# Patient Record
Sex: Female | Born: 1985 | Race: White | Hispanic: No | Marital: Married | Smoking: Never smoker
Health system: Southern US, Community
[De-identification: ages and names within clinical notes are randomized; demographics above are authoritative.]

## PROBLEM LIST (undated history)

## (undated) DIAGNOSIS — K219 Gastro-esophageal reflux disease without esophagitis: Secondary | ICD-10-CM

## (undated) DIAGNOSIS — F329 Major depressive disorder, single episode, unspecified: Secondary | ICD-10-CM

## (undated) DIAGNOSIS — F32A Depression, unspecified: Secondary | ICD-10-CM

## (undated) DIAGNOSIS — D219 Benign neoplasm of connective and other soft tissue, unspecified: Secondary | ICD-10-CM

## (undated) HISTORY — DX: Gastro-esophageal reflux disease without esophagitis: K21.9

## (undated) HISTORY — DX: Depression, unspecified: F32.A

## (undated) HISTORY — DX: Benign neoplasm of connective and other soft tissue, unspecified: D21.9

---
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
## 2019-07-06 LAB — OB RESULTS CONSOLE GC/CHLAMYDIA
Chlamydia: NEGATIVE
Gonorrhea: NEGATIVE

## 2019-07-06 LAB — OB RESULTS CONSOLE HEPATITIS B SURFACE ANTIGEN: Hepatitis B Surface Ag: NEGATIVE

## 2019-07-06 LAB — OB RESULTS CONSOLE ABO/RH: RH Type: NEGATIVE

## 2019-07-06 LAB — OB RESULTS CONSOLE RPR: RPR: NONREACTIVE

## 2019-07-06 LAB — OB RESULTS CONSOLE HIV ANTIBODY (ROUTINE TESTING): HIV: NONREACTIVE

## 2019-07-06 LAB — OB RESULTS CONSOLE ANTIBODY SCREEN: Antibody Screen: NEGATIVE

## 2019-07-06 LAB — OB RESULTS CONSOLE RUBELLA ANTIBODY, IGM: Rubella: IMMUNE

## 2020-01-02 LAB — OB RESULTS CONSOLE GBS: GBS: NEGATIVE

## 2020-01-22 ENCOUNTER — Telehealth (HOSPITAL_COMMUNITY): Payer: Self-pay | Admitting: *Deleted

## 2020-01-22 ENCOUNTER — Encounter (HOSPITAL_COMMUNITY): Payer: Self-pay | Admitting: *Deleted

## 2020-01-22 NOTE — Telephone Encounter (Signed)
Preadmission screen  

## 2020-01-23 ENCOUNTER — Telehealth (HOSPITAL_COMMUNITY): Payer: Self-pay | Admitting: *Deleted

## 2020-01-23 NOTE — Telephone Encounter (Signed)
Preadmission screen  

## 2020-01-24 ENCOUNTER — Telehealth (HOSPITAL_COMMUNITY): Payer: Self-pay | Admitting: *Deleted

## 2020-01-24 NOTE — Telephone Encounter (Signed)
Preadmission screen  

## 2020-01-26 ENCOUNTER — Telehealth (HOSPITAL_COMMUNITY): Payer: Self-pay | Admitting: *Deleted

## 2020-01-26 NOTE — Telephone Encounter (Signed)
Preadmission screen  

## 2020-01-29 ENCOUNTER — Telehealth (HOSPITAL_COMMUNITY): Payer: Self-pay | Admitting: *Deleted

## 2020-01-29 NOTE — Telephone Encounter (Signed)
Preadmission screen  

## 2020-01-30 ENCOUNTER — Encounter (HOSPITAL_COMMUNITY): Payer: Self-pay | Admitting: *Deleted

## 2020-01-30 ENCOUNTER — Telehealth (HOSPITAL_COMMUNITY): Payer: Self-pay | Admitting: *Deleted

## 2020-01-30 NOTE — Telephone Encounter (Signed)
Preadmission screen  

## 2020-01-31 ENCOUNTER — Other Ambulatory Visit (HOSPITAL_COMMUNITY)
Admission: RE | Admit: 2020-01-31 | Discharge: 2020-01-31 | Disposition: A | Discharge status: Home / Self Care | Payer: Managed Care, Other (non HMO) | Source: Ambulatory Visit | Attending: Obstetrics and Gynecology | Admitting: Obstetrics and Gynecology

## 2020-01-31 LAB — SARS CORONAVIRUS 2 (TAT 6-24 HRS): SARS Coronavirus 2: NEGATIVE

## 2020-02-01 ENCOUNTER — Other Ambulatory Visit: Payer: Self-pay | Admitting: Obstetrics and Gynecology

## 2020-02-01 NOTE — H&P (Signed)
Shelby Oconnor is a 34 y.o. female G2P1001 at 50 4/7 weeks (EDD 01/29/20 by LMP c/w 10 week [place])  presenting for  OB History    Gravida  2   Para  1   Term      Preterm      AB      Living        SAB      TAB      Ectopic      Multiple      Live Births             Past Medical History:  Diagnosis Date  . Depression   . Fibroid   . GERD (gastroesophageal reflux disease)    No past surgical history on file. Family History: family history includes Anxiety disorder in her daughter and sister; Cancer in her mother and paternal grandmother; Depression in her maternal aunt; Diabetes in her maternal aunt and paternal grandmother. Social History:  reports that she has never smoked. She has never used smokeless tobacco. She reports previous alcohol use. She reports that she does not use drugs.     Maternal Diabetes: No Genetic Screening: Declined Maternal Ultrasounds/Referrals: Normal Fetal Ultrasounds or other Referrals:  None Maternal Substance Abuse:  No Significant Maternal Medications:  None Significant Maternal Lab Results:  Rh negative Other Comments:  None  Review of Systems  Constitutional: Negative for fever.  Gastrointestinal: Negative for abdominal pain.   Maternal Medical History:  Contractions: Frequency: rare.   Perceived severity is mild.    Fetal activity: Perceived fetal activity is normal.    Prenatal complications: no prenatal complications Prenatal Complications - Diabetes: none.      Last menstrual period 04/24/2019. Maternal Exam:  Uterine Assessment: Contraction strength is mild.  Contraction frequency is irregular.   Abdomen: Patient reports no abdominal tenderness. Introitus: Normal vulva. Normal vagina.    Physical Exam  Constitutional: She appears well-developed.  Cardiovascular: Normal rate and regular rhythm.  Respiratory: Effort normal.  GI: Soft.  Genitourinary:    Vulva and vagina normal.   Neurological: She is alert.   Psychiatric: She has a normal mood and affect.    Prenatal labs: ABO, Rh: O/Negative/-- (08/06 0000) Antibody: Negative (08/06 0000) Rubella: Immune (08/06 0000) RPR: Nonreactive (08/06 0000)  HBsAg: Negative (08/06 0000)  HIV: Non-reactive (08/06 0000)  GBS: Negative/-- (02/02 0000)  One hour GCT 130  Assessment/Plan: Pt for IOL at term, plan AROM and pitocin.  Epidural prn.  Rh negative   Logan Bores 02/01/2020, 2:41 PM

## 2020-02-01 NOTE — H&P (Deleted)
  The note originally documented on this encounter has been moved the the encounter in which it belongs.  

## 2020-02-02 ENCOUNTER — Inpatient Hospital Stay (HOSPITAL_COMMUNITY)
Admission: AD | Admit: 2020-02-02 | Discharge: 2020-02-04 | DRG: 788 | Disposition: A | Discharge status: Home / Self Care | Payer: Managed Care, Other (non HMO) | Attending: Obstetrics and Gynecology | Admitting: Obstetrics and Gynecology

## 2020-02-02 ENCOUNTER — Inpatient Hospital Stay (HOSPITAL_COMMUNITY): Payer: Managed Care, Other (non HMO)

## 2020-02-02 ENCOUNTER — Inpatient Hospital Stay (HOSPITAL_COMMUNITY): Payer: Managed Care, Other (non HMO) | Admitting: Anesthesiology

## 2020-02-02 ENCOUNTER — Other Ambulatory Visit: Payer: Self-pay

## 2020-02-02 ENCOUNTER — Encounter (HOSPITAL_COMMUNITY): Payer: Self-pay | Admitting: Obstetrics and Gynecology

## 2020-02-02 ENCOUNTER — Encounter (HOSPITAL_COMMUNITY)
Admission: AD | Disposition: A | Discharge status: Home / Self Care | Payer: Self-pay | Source: Home / Self Care | Attending: Obstetrics and Gynecology

## 2020-02-02 DIAGNOSIS — Z3A40 40 weeks gestation of pregnancy: Secondary | ICD-10-CM | POA: Diagnosis not present

## 2020-02-02 DIAGNOSIS — O26893 Other specified pregnancy related conditions, third trimester: Principal | ICD-10-CM | POA: Diagnosis present

## 2020-02-02 DIAGNOSIS — Z20822 Contact with and (suspected) exposure to covid-19: Secondary | ICD-10-CM | POA: Diagnosis present

## 2020-02-02 DIAGNOSIS — Z6791 Unspecified blood type, Rh negative: Secondary | ICD-10-CM | POA: Diagnosis not present

## 2020-02-02 DIAGNOSIS — Z98891 History of uterine scar from previous surgery: Secondary | ICD-10-CM

## 2020-02-02 LAB — CBC
HCT: 38.8 % (ref 36.0–46.0)
Hemoglobin: 12.9 g/dL (ref 12.0–15.0)
MCH: 29.7 pg (ref 26.0–34.0)
MCHC: 33.2 g/dL (ref 30.0–36.0)
MCV: 89.4 fL (ref 80.0–100.0)
Platelets: 299 10*3/uL (ref 150–400)
RBC: 4.34 MIL/uL (ref 3.87–5.11)
RDW: 14.6 % (ref 11.5–15.5)
WBC: 10.2 10*3/uL (ref 4.0–10.5)
nRBC: 0 % (ref 0.0–0.2)

## 2020-02-02 LAB — RPR: RPR Ser Ql: NONREACTIVE

## 2020-02-02 LAB — COMPREHENSIVE METABOLIC PANEL
ALT: 27 U/L (ref 0–44)
AST: 25 U/L (ref 15–41)
Albumin: 2.8 g/dL — ABNORMAL LOW (ref 3.5–5.0)
Alkaline Phosphatase: 180 U/L — ABNORMAL HIGH (ref 38–126)
Anion gap: 12 (ref 5–15)
BUN: 9 mg/dL (ref 6–20)
CO2: 19 mmol/L — ABNORMAL LOW (ref 22–32)
Calcium: 9.3 mg/dL (ref 8.9–10.3)
Chloride: 105 mmol/L (ref 98–111)
Creatinine, Ser: 0.61 mg/dL (ref 0.44–1.00)
GFR calc Af Amer: >60 mL/min (ref >60)
GFR calc non Af Amer: >60 mL/min (ref >60)
Glucose, Bld: 110 mg/dL — ABNORMAL HIGH (ref 70–99)
Potassium: 3.6 mmol/L (ref 3.5–5.1)
Sodium: 136 mmol/L (ref 135–145)
Total Bilirubin: 0.3 mg/dL (ref 0.3–1.2)
Total Protein: 6 g/dL — ABNORMAL LOW (ref 6.5–8.1)

## 2020-02-02 LAB — TYPE AND SCREEN
ABO/RH(D): O NEG
Antibody Screen: NEGATIVE

## 2020-02-02 LAB — PROTEIN / CREATININE RATIO, URINE
Creatinine, Urine: 47.09 mg/dL
Protein Creatinine Ratio: 0.25 mg/mg{Cre} — ABNORMAL HIGH (ref 0.00–0.15)
Total Protein, Urine: 12 mg/dL

## 2020-02-02 SURGERY — Surgical Case
Anesthesia: Epidural | Wound class: Clean Contaminated

## 2020-02-02 MED ORDER — PHENYLEPHRINE 40 MCG/ML (10ML) SYRINGE FOR IV PUSH (FOR BLOOD PRESSURE SUPPORT)
80.0000 ug | PREFILLED_SYRINGE | INTRAVENOUS | Status: DC | PRN
  Filled 2020-02-02: qty 10

## 2020-02-02 MED ORDER — OXYCODONE-ACETAMINOPHEN 5-325 MG PO TABS: 1.0000 | ORAL_TABLET | ORAL | Status: DC | PRN

## 2020-02-02 MED ORDER — LACTATED RINGERS IV SOLN
500.0000 mL | Freq: Once | INTRAVENOUS | Status: AC
  Administered 2020-02-02: 500 mL via INTRAVENOUS

## 2020-02-02 MED ORDER — BUTORPHANOL TARTRATE 1 MG/ML IJ SOLN: 1.0000 mg | INTRAMUSCULAR | Status: DC | PRN

## 2020-02-02 MED ORDER — SODIUM CHLORIDE (PF) 0.9 % IJ SOLN
INTRAMUSCULAR | Status: DC | PRN
  Administered 2020-02-02: 12 mL/h via EPIDURAL

## 2020-02-02 MED ORDER — NALOXONE HCL 4 MG/10ML IJ SOLN
1.0000 ug/kg/h | INTRAVENOUS | Status: DC | PRN
  Filled 2020-02-02: qty 5

## 2020-02-02 MED ORDER — SCOPOLAMINE 1 MG/3DAYS TD PT72
1.0000 | MEDICATED_PATCH | Freq: Once | TRANSDERMAL | Status: DC
  Administered 2020-02-02: 1.5 mg via TRANSDERMAL

## 2020-02-02 MED ORDER — LIDOCAINE-EPINEPHRINE (PF) 2 %-1:200000 IJ SOLN
INTRAMUSCULAR | Status: DC | PRN
  Administered 2020-02-02 (×2): 2 mL via EPIDURAL

## 2020-02-02 MED ORDER — OXYCODONE-ACETAMINOPHEN 5-325 MG PO TABS: 2.0000 | ORAL_TABLET | ORAL | Status: DC | PRN

## 2020-02-02 MED ORDER — CEFAZOLIN SODIUM-DEXTROSE 2-4 GM/100ML-% IV SOLN
2.0000 g | Freq: Once | INTRAVENOUS | Status: AC
  Administered 2020-02-02: 2 g via INTRAVENOUS

## 2020-02-02 MED ORDER — OXYTOCIN 40 UNITS IN NORMAL SALINE INFUSION - SIMPLE MED
INTRAVENOUS | Status: AC
  Filled 2020-02-02: qty 1000

## 2020-02-02 MED ORDER — NALBUPHINE HCL 10 MG/ML IJ SOLN: 5.0000 mg | Freq: Once | INTRAMUSCULAR | Status: DC | PRN

## 2020-02-02 MED ORDER — NALOXONE HCL 0.4 MG/ML IJ SOLN: 0.4000 mg | INTRAMUSCULAR | Status: DC | PRN

## 2020-02-02 MED ORDER — SENNOSIDES-DOCUSATE SODIUM 8.6-50 MG PO TABS
2.0000 | ORAL_TABLET | ORAL | Status: DC
  Administered 2020-02-03 (×2): 2 via ORAL
  Filled 2020-02-02 (×2): qty 2

## 2020-02-02 MED ORDER — DEXAMETHASONE SODIUM PHOSPHATE 10 MG/ML IJ SOLN
INTRAMUSCULAR | Status: AC
  Filled 2020-02-02: qty 1

## 2020-02-02 MED ORDER — IBUPROFEN 800 MG PO TABS
800.0000 mg | ORAL_TABLET | Freq: Three times a day (TID) | ORAL | Status: DC
  Administered 2020-02-03 – 2020-02-04 (×5): 800 mg via ORAL
  Filled 2020-02-02 (×5): qty 1

## 2020-02-02 MED ORDER — DIPHENHYDRAMINE HCL 50 MG/ML IJ SOLN: 12.5000 mg | INTRAMUSCULAR | Status: DC | PRN

## 2020-02-02 MED ORDER — LACTATED RINGERS IV SOLN: INTRAVENOUS | Status: DC

## 2020-02-02 MED ORDER — DEXAMETHASONE SODIUM PHOSPHATE 10 MG/ML IJ SOLN
INTRAMUSCULAR | Status: DC | PRN
  Administered 2020-02-02: 10 mg via INTRAVENOUS

## 2020-02-02 MED ORDER — SIMETHICONE 80 MG PO CHEW
80.0000 mg | CHEWABLE_TABLET | Freq: Three times a day (TID) | ORAL | Status: DC
  Administered 2020-02-03 – 2020-02-04 (×5): 80 mg via ORAL
  Filled 2020-02-02 (×5): qty 1

## 2020-02-02 MED ORDER — ONDANSETRON HCL 4 MG/2ML IJ SOLN: 4.0000 mg | Freq: Three times a day (TID) | INTRAMUSCULAR | Status: DC | PRN

## 2020-02-02 MED ORDER — FENTANYL-BUPIVACAINE-NACL 0.5-0.125-0.9 MG/250ML-% EP SOLN
12.0000 mL/h | EPIDURAL | Status: DC | PRN
  Filled 2020-02-02: qty 250

## 2020-02-02 MED ORDER — ONDANSETRON HCL 4 MG/2ML IJ SOLN
INTRAMUSCULAR | Status: AC
  Filled 2020-02-02: qty 2

## 2020-02-02 MED ORDER — SIMETHICONE 80 MG PO CHEW: 80.0000 mg | CHEWABLE_TABLET | ORAL | Status: DC | PRN

## 2020-02-02 MED ORDER — SODIUM CHLORIDE 0.9 % IV SOLN: INTRAVENOUS | Status: DC | PRN

## 2020-02-02 MED ORDER — COCONUT OIL OIL
1.0000 "application " | TOPICAL_OIL | Status: DC | PRN
  Administered 2020-02-03: 1 via TOPICAL

## 2020-02-02 MED ORDER — TERBUTALINE SULFATE 1 MG/ML IJ SOLN: 0.2500 mg | Freq: Once | INTRAMUSCULAR | Status: DC | PRN

## 2020-02-02 MED ORDER — ACETAMINOPHEN 325 MG PO TABS
650.0000 mg | ORAL_TABLET | ORAL | Status: DC | PRN
  Administered 2020-02-03 (×2): 650 mg via ORAL
  Filled 2020-02-02 (×3): qty 2

## 2020-02-02 MED ORDER — BUPIVACAINE HCL (PF) 0.75 % IJ SOLN
INTRAMUSCULAR | Status: DC | PRN
  Administered 2020-02-02: 1.2 mL

## 2020-02-02 MED ORDER — MENTHOL 3 MG MT LOZG: 1.0000 | LOZENGE | OROMUCOSAL | Status: DC | PRN

## 2020-02-02 MED ORDER — OXYTOCIN 40 UNITS IN NORMAL SALINE INFUSION - SIMPLE MED
2.5000 [IU]/h | INTRAVENOUS | Status: DC
  Administered 2020-02-02: 40 [IU] via INTRAVENOUS

## 2020-02-02 MED ORDER — PHENYLEPHRINE 40 MCG/ML (10ML) SYRINGE FOR IV PUSH (FOR BLOOD PRESSURE SUPPORT): 80.0000 ug | PREFILLED_SYRINGE | INTRAVENOUS | Status: DC | PRN

## 2020-02-02 MED ORDER — TETANUS-DIPHTH-ACELL PERTUSSIS 5-2.5-18.5 LF-MCG/0.5 IM SUSP: 0.5000 mL | Freq: Once | INTRAMUSCULAR | Status: DC

## 2020-02-02 MED ORDER — EPHEDRINE 5 MG/ML INJ: 10.0000 mg | INTRAVENOUS | Status: DC | PRN

## 2020-02-02 MED ORDER — OXYTOCIN 40 UNITS IN NORMAL SALINE INFUSION - SIMPLE MED
1.0000 m[IU]/min | INTRAVENOUS | Status: DC
  Administered 2020-02-02: 2 m[IU]/min via INTRAVENOUS
  Filled 2020-02-02: qty 1000

## 2020-02-02 MED ORDER — SODIUM CHLORIDE 0.9 % IR SOLN
Status: DC | PRN
  Administered 2020-02-02: 1000 mL

## 2020-02-02 MED ORDER — PRENATAL MULTIVITAMIN CH
1.0000 | ORAL_TABLET | Freq: Every day | ORAL | Status: DC
  Administered 2020-02-03 – 2020-02-04 (×2): 1 via ORAL
  Filled 2020-02-02 (×2): qty 1

## 2020-02-02 MED ORDER — DIBUCAINE (PERIANAL) 1 % EX OINT: 1.0000 "application " | TOPICAL_OINTMENT | CUTANEOUS | Status: DC | PRN

## 2020-02-02 MED ORDER — PHENYLEPHRINE HCL-NACL 20-0.9 MG/250ML-% IV SOLN
INTRAVENOUS | Status: AC
  Filled 2020-02-02: qty 250

## 2020-02-02 MED ORDER — SOD CITRATE-CITRIC ACID 500-334 MG/5ML PO SOLN
30.0000 mL | ORAL | Status: DC | PRN
  Filled 2020-02-02: qty 30

## 2020-02-02 MED ORDER — PROMETHAZINE HCL 25 MG/ML IJ SOLN: 6.2500 mg | INTRAMUSCULAR | Status: DC | PRN

## 2020-02-02 MED ORDER — DIPHENHYDRAMINE HCL 25 MG PO CAPS: 25.0000 mg | ORAL_CAPSULE | ORAL | Status: DC | PRN

## 2020-02-02 MED ORDER — OXYTOCIN BOLUS FROM INFUSION: 500.0000 mL | Freq: Once | INTRAVENOUS | Status: DC

## 2020-02-02 MED ORDER — MORPHINE SULFATE (PF) 0.5 MG/ML IJ SOLN
INTRAMUSCULAR | Status: AC
  Filled 2020-02-02: qty 10

## 2020-02-02 MED ORDER — DIPHENHYDRAMINE HCL 25 MG PO CAPS: 25.0000 mg | ORAL_CAPSULE | Freq: Four times a day (QID) | ORAL | Status: DC | PRN

## 2020-02-02 MED ORDER — NALBUPHINE HCL 10 MG/ML IJ SOLN: 5.0000 mg | INTRAMUSCULAR | Status: DC | PRN

## 2020-02-02 MED ORDER — MEPERIDINE HCL 25 MG/ML IJ SOLN: 6.2500 mg | INTRAMUSCULAR | Status: DC | PRN

## 2020-02-02 MED ORDER — FENTANYL CITRATE (PF) 100 MCG/2ML IJ SOLN
INTRAMUSCULAR | Status: AC
  Filled 2020-02-02: qty 2

## 2020-02-02 MED ORDER — WITCH HAZEL-GLYCERIN EX PADS: 1.0000 "application " | MEDICATED_PAD | CUTANEOUS | Status: DC | PRN

## 2020-02-02 MED ORDER — ONDANSETRON HCL 4 MG/2ML IJ SOLN
4.0000 mg | Freq: Four times a day (QID) | INTRAMUSCULAR | Status: DC | PRN
  Administered 2020-02-02: 20:00:00 4 mg via INTRAVENOUS

## 2020-02-02 MED ORDER — SIMETHICONE 80 MG PO CHEW
80.0000 mg | CHEWABLE_TABLET | ORAL | Status: DC
  Administered 2020-02-03 (×2): 80 mg via ORAL
  Filled 2020-02-02 (×2): qty 1

## 2020-02-02 MED ORDER — FENTANYL CITRATE (PF) 100 MCG/2ML IJ SOLN
INTRAMUSCULAR | Status: DC | PRN
  Administered 2020-02-02: 10 ug via INTRATHECAL

## 2020-02-02 MED ORDER — SCOPOLAMINE 1 MG/3DAYS TD PT72
MEDICATED_PATCH | TRANSDERMAL | Status: AC
  Filled 2020-02-02: qty 1

## 2020-02-02 MED ORDER — ACETAMINOPHEN 325 MG PO TABS: 650.0000 mg | ORAL_TABLET | ORAL | Status: DC | PRN

## 2020-02-02 MED ORDER — LACTATED RINGERS IV SOLN: INTRAVENOUS | Status: DC | PRN

## 2020-02-02 MED ORDER — OXYTOCIN 40 UNITS IN NORMAL SALINE INFUSION - SIMPLE MED: 2.5000 [IU]/h | INTRAVENOUS | Status: AC

## 2020-02-02 MED ORDER — SODIUM CHLORIDE 0.9% FLUSH: 3.0000 mL | INTRAVENOUS | Status: DC | PRN

## 2020-02-02 MED ORDER — OXYCODONE HCL 5 MG PO TABS
5.0000 mg | ORAL_TABLET | ORAL | Status: DC | PRN
  Administered 2020-02-03 – 2020-02-04 (×3): 5 mg via ORAL
  Filled 2020-02-02 (×3): qty 1

## 2020-02-02 MED ORDER — LACTATED RINGERS IV SOLN
500.0000 mL | INTRAVENOUS | Status: DC | PRN
  Administered 2020-02-02: 500 mL via INTRAVENOUS

## 2020-02-02 MED ORDER — MORPHINE SULFATE (PF) 0.5 MG/ML IJ SOLN
INTRAMUSCULAR | Status: DC | PRN
  Administered 2020-02-02: .1 mg via INTRATHECAL

## 2020-02-02 MED ORDER — ZOLPIDEM TARTRATE 5 MG PO TABS: 5.0000 mg | ORAL_TABLET | Freq: Every evening | ORAL | Status: DC | PRN

## 2020-02-02 MED ORDER — LIDOCAINE HCL (PF) 1 % IJ SOLN: 30.0000 mL | INTRAMUSCULAR | Status: DC | PRN

## 2020-02-02 MED ORDER — PHENYLEPHRINE HCL-NACL 20-0.9 MG/250ML-% IV SOLN
INTRAVENOUS | Status: DC | PRN
  Administered 2020-02-02: 60 ug/min via INTRAVENOUS

## 2020-02-02 MED ORDER — FENTANYL CITRATE (PF) 100 MCG/2ML IJ SOLN: 25.0000 ug | INTRAMUSCULAR | Status: DC | PRN

## 2020-02-02 MED ORDER — CEFAZOLIN SODIUM-DEXTROSE 2-4 GM/100ML-% IV SOLN
INTRAVENOUS | Status: AC
  Filled 2020-02-02: qty 100

## 2020-02-02 SURGICAL SUPPLY — 37 items
BENZOIN TINCTURE PRP APPL 2/3 (GAUZE/BANDAGES/DRESSINGS) ×3 IMPLANT
CHLORAPREP W/TINT 26ML (MISCELLANEOUS) ×3 IMPLANT
CLAMP CORD UMBIL (MISCELLANEOUS) IMPLANT
CLOSURE STERI STRIP 1/2 X4 (GAUZE/BANDAGES/DRESSINGS) ×3 IMPLANT
CLOSURE WOUND 1/2 X4 (GAUZE/BANDAGES/DRESSINGS)
CLOTH BEACON ORANGE TIMEOUT ST (SAFETY) ×3 IMPLANT
DRSG OPSITE POSTOP 4X10 (GAUZE/BANDAGES/DRESSINGS) ×3 IMPLANT
ELECT REM PT RETURN 9FT ADLT (ELECTROSURGICAL) ×3
ELECTRODE REM PT RTRN 9FT ADLT (ELECTROSURGICAL) ×1 IMPLANT
EXTRACTOR VACUUM KIWI (MISCELLANEOUS) IMPLANT
GLOVE BIO SURGEON STRL SZ 6.5 (GLOVE) ×2 IMPLANT
GLOVE BIO SURGEONS STRL SZ 6.5 (GLOVE) ×1
GLOVE BIOGEL PI IND STRL 7.0 (GLOVE) ×1 IMPLANT
GLOVE BIOGEL PI INDICATOR 7.0 (GLOVE) ×2
GOWN STRL REUS W/TWL LRG LVL3 (GOWN DISPOSABLE) ×6 IMPLANT
HOVERMATT SINGLE USE (MISCELLANEOUS) ×3 IMPLANT
KIT ABG SYR 3ML LUER SLIP (SYRINGE) IMPLANT
NEEDLE HYPO 25X5/8 SAFETYGLIDE (NEEDLE) IMPLANT
NS IRRIG 1000ML POUR BTL (IV SOLUTION) ×3 IMPLANT
PACK C SECTION WH (CUSTOM PROCEDURE TRAY) ×3 IMPLANT
PAD OB MATERNITY 4.3X12.25 (PERSONAL CARE ITEMS) ×3 IMPLANT
PENCIL SMOKE EVAC W/HOLSTER (ELECTROSURGICAL) ×3 IMPLANT
RTRCTR C-SECT PINK 25CM LRG (MISCELLANEOUS) ×3 IMPLANT
STRIP CLOSURE SKIN 1/2X4 (GAUZE/BANDAGES/DRESSINGS) IMPLANT
SUT CHROMIC 1 CTX 36 (SUTURE) ×6 IMPLANT
SUT PLAIN 0 NONE (SUTURE) IMPLANT
SUT PLAIN 2 0 XLH (SUTURE) ×3 IMPLANT
SUT VIC AB 0 CT1 27 (SUTURE) ×4
SUT VIC AB 0 CT1 27XBRD ANBCTR (SUTURE) ×2 IMPLANT
SUT VIC AB 2-0 CT1 27 (SUTURE) ×2
SUT VIC AB 2-0 CT1 TAPERPNT 27 (SUTURE) ×1 IMPLANT
SUT VIC AB 3-0 CT1 27 (SUTURE)
SUT VIC AB 3-0 CT1 TAPERPNT 27 (SUTURE) IMPLANT
SUT VIC AB 4-0 KS 27 (SUTURE) ×3 IMPLANT
TOWEL OR 17X24 6PK STRL BLUE (TOWEL DISPOSABLE) ×3 IMPLANT
TRAY FOLEY W/BAG SLVR 14FR LF (SET/KITS/TRAYS/PACK) ×3 IMPLANT
WATER STERILE IRR 1000ML POUR (IV SOLUTION) ×3 IMPLANT

## 2020-02-02 NOTE — Progress Notes (Signed)
Patient ID: Shelby Oconnor, female   DOB: [birth year], 34 y.o.   MRN: [id number] Pt having some pain on her left side s/p epidural with contractions, feeling some rectal pressure  afeb VSS FHR category 1  Cervix 80/8/-1   IUPC placed to adjust pitocin on 8 mu  Pt progressing, baby feels OP so placed in exaggerated Sims to see if can get more descent  Will follow

## 2020-02-02 NOTE — Op Note (Signed)
Operative Note    Preoperative Diagnosis Term pregnancy at 40 4/7 weeks Arrest of descent  Postoperative Diagnosis same  Procedure Primary Low Transverse C-section with 2 layer closure of uterus  Surgeon Paula Compton, MD  Anesthesia Spinal  Fluids: EBL 225mL UOP 243mL  IVF 2266ml LR  Findings Viable female infant in the vertex presentation.  Apgars 8,9.  Weight pending Nml uterus, tubes and ovaries    Specimen Placenta to L&D  Procedure Note  Patient was taken to the operating room where epidural anesthesia was pulled as it was not working on left side and a spinal was placed and found to be adequate by testing. She was prepped and draped in the normal sterile fashion in the dorsal supine position with a leftward tilt. An appropriate time out was performed. A Pfannenstiel skin incision was then made with the scalpel and carried through to the underlying layer of fascia by sharp dissection and Bovie cautery. The fascia was nicked in the midline and the incision was extended laterally with Mayo scissors. The inferior aspect of the incision was grasped Coker clamps and dissected off the underlying rectus muscles. In a similar fashion the superior aspect was dissected off the rectus muscles. Rectus muscles were separated in the midline and the peritoneal cavity entered bluntly. The peritoneal incision was then extended both superiorly and inferiorly with careful attention to avoid both bowel and bladder. The Alexis self-retaining wound retractor was then placed within the incision and the lower uterine segment exposed. The bladder flap was developed with Metzenbaum scissors and pushed away from the lower uterine segment. The lower uterine segment was then incised in a transverse fashion and the cavity itself entered bluntly. The incision was extended bluntly. The infant's head was then lifted and delivered from the incision without difficulty. The remainder of the infant delivered and  the nose and mouth bulb suctioned with the cord clamped and cut as well. The infant was handed off to the waiting pediatricians. The placenta was then spontaneously expressed from the uterus and the uterus cleared of all clots and debris with moist lap sponge. The uterine incision was then repaired in 2 layers the first layer was a running locked layer 1-0 chromic and the second an imbricating layer of the same suture. The tubes and ovaries were inspected and the gutters cleared of all clots and debris. The uterine incision was inspected and found to be hemostatic. All instruments and sponges as well as the Alexis retractor were then removed from the abdomen. The rectus muscles and peritoneum were then reapproximated with several interrupted mattress sutures of 2-0 Vicryl. The fascia was then closed with 0 Vicryl in a running fashion. Subcutaneous tissue was reapproximated with 3-0 plain in a running fashion. The skin was closed with a subcuticular stitch of 4-0 Vicryl on a Keith needle and then reinforced with benzoin and Steri-Strips. At the conclusion of the procedure all instruments and sponge counts were correct. Patient was taken to the recovery room in good condition with her baby accompanying her skin to skin.

## 2020-02-02 NOTE — Progress Notes (Signed)
Patient ID: Shelby Oconnor, female   DOB: 1986/09/07, 34 y.o.   MRN: [id number] Pt reexamined and fetal station still about a 0 with contraction.    Pitocin had been turn down to 84mu from 71mu and is now being increased again to get fully adequate contractions  Attempted pushing with handles and rope pull with some improved effort, but still not much progress c/c/0 Placed in right Sims and will assess at 630 or 645 or if pt gets too uncomfortable prior.  FHR with good variability and occasional variable decels

## 2020-02-02 NOTE — Plan of Care (Signed)
Discussed GHTN in pregnancy and S&S to watch out for. Information sheet given to pt for referral.

## 2020-02-02 NOTE — Anesthesia Postprocedure Evaluation (Signed)
Anesthesia Post Note  Patient: Shelby Oconnor  Procedure(s) Performed: CESAREAN SECTION (N/A )     Patient location during evaluation: PACU Anesthesia Type: Epidural Level of consciousness: awake and alert Pain management: pain level controlled Vital Signs Assessment: post-procedure vital signs reviewed and stable Respiratory status: spontaneous breathing and respiratory function stable Cardiovascular status: blood pressure returned to baseline and stable Postop Assessment: spinal receding Anesthetic complications: no    Last Vitals:  Vitals:   02/02/20 2215 02/02/20 2235  BP: 137/90 138/85  Pulse: 95 94  Resp: (!) 25 20  Temp: 36.6 C   SpO2: 100%     Last Pain:  Vitals:   02/02/20 2215  TempSrc:   PainSc: 0-No pain   Pain Goal: Patients Stated Pain Goal: 5 (02/02/20 0931)  LLE Motor Response: Purposeful movement (02/02/20 2201)   RLE Motor Response: Purposeful movement (02/02/20 2201)       Epidural/Spinal Function Cutaneous sensation: Able to Wiggle Toes (02/02/20 2201), Patient able to flex knees: Yes (02/02/20 2201), Patient able to lift hips off bed: Yes (02/02/20 2201), Back pain beyond tenderness at insertion site: No (02/02/20 2201), Progressively worsening motor and/or sensory loss: No (02/02/20 2201), Bowel and/or bladder incontinence post epidural: No (02/02/20 2201)  Delquan Poucher DANIEL

## 2020-02-02 NOTE — Progress Notes (Signed)
Patient ID: Shelby Oconnor, female   DOB: 11/03/1986, 34 y.o.   MRN: [id number] Pt began feeling pressure and FHR had some prolonged variable decels   Cervix c/c/0   FHR decels improved with repositioning  Practice push attempted with poor maternal effort so will give a bit more time and then try again.  BP were elevated when first arrived, WNL since epidural and all PIH labs negative

## 2020-02-02 NOTE — Transfer of Care (Signed)
Immediate Anesthesia Transfer of Care Note  Patient: Shelby Oconnor  Procedure(s) Performed: CESAREAN SECTION (N/A )  Patient Location: PACU  Anesthesia Type:Spinal  Level of Consciousness: awake, alert , oriented and patient cooperative  Airway & Oxygen Therapy: Patient Spontanous Breathing  Post-op Assessment: Report given to RN and Post -op Vital signs reviewed and stable  Post vital signs: Reviewed and stable  Last Vitals:  Vitals Value Taken Time  BP    Temp    Pulse 105 02/02/20 2040  Resp    SpO2 97 % 02/02/20 2040  Vitals shown include unvalidated device data.  Last Pain:  Vitals:   02/02/20 1900  TempSrc: Axillary  PainSc: 10-Worst pain ever      Patients Stated Pain Goal: 5 ([id number])  Complications: No apparent anesthesia complications

## 2020-02-02 NOTE — Progress Notes (Signed)
Pt. Interviewed.  Denies any history of anesthesia complications in her or family.  Denies history of MH.  BMI 36.  Pt. Reports no pertinent medical history or problems with this pregnancy.   Questions and concerns verbalized by patient addressed.  Pt. Informed anesthesia team is available 24/7 as needed.  Rico Sheehan CRNA

## 2020-02-02 NOTE — Anesthesia Procedure Notes (Signed)
Epidural Patient location during procedure: OB Start time: 02/02/2020 12:45 PM End time: 02/02/2020 12:55 PM  Staffing Anesthesiologist: Freddrick March, MD Performed: anesthesiologist   Preanesthetic Checklist Completed: patient identified, IV checked, risks and benefits discussed, monitors and equipment checked, pre-op evaluation and timeout performed  Epidural Patient position: sitting Prep: DuraPrep and site prepped and draped Patient monitoring: continuous pulse ox, blood pressure, heart rate and cardiac monitor Approach: midline Location: L3-L4 Injection technique: LOR air  Needle:  Needle type: Tuohy  Needle gauge: 17 G Needle length: 9 cm Needle insertion depth: 5 cm Catheter type: closed end flexible Catheter size: 19 Gauge Catheter at skin depth: 11 cm Test dose: negative  Assessment Sensory level: T8 Events: blood not aspirated, injection not painful, no injection resistance, no paresthesia and negative IV test  Additional Notes Patient identified. Risks/Benefits/Options discussed with patient including but not limited to bleeding, infection, nerve damage, paralysis, failed block, incomplete pain control, headache, blood pressure changes, nausea, vomiting, reactions to medication both or allergic, itching and postpartum back pain. Confirmed with bedside nurse the patient's most recent platelet count. Confirmed with patient that they are not currently taking any anticoagulation, have any bleeding history or any family history of bleeding disorders. Patient expressed understanding and wished to proceed. All questions were answered. Sterile technique was used throughout the entire procedure. Please see nursing notes for vital signs. Test dose was given through epidural catheter and negative prior to continuing to dose epidural or start infusion. Warning signs of high block given to the patient including shortness of breath, tingling/numbness in hands, complete motor block,  or any concerning symptoms with instructions to call for help. Patient was given instructions on fall risk and not to get out of bed. All questions and concerns addressed with instructions to call with any issues or inadequate analgesia.  Reason for block:procedure for pain

## 2020-02-02 NOTE — Progress Notes (Signed)
Patient ID: Shelby Oconnor, female   DOB: 1986-10-14, 34 y.o.   MRN: [id number] Pt became too uncomfortable to labor down so we began to push again with improved maternal effort.  Despite good effort for 45 minutes no significant descent of baby noted and still at a 0 station with caput  Pt epidural not working on left off and on throughout the day.  D/w pt and husband I feel given we have given it 2 hours since complete dilation with intermittent pushing and no progress, I suspect CPD and recommend proceeding with c-section.    We discussed risks and benefits of bleeding,  infection and possible damage to bowel and bladder. D/w anesthesia and patient best option to gain adequate block and we are going to pull the epidural and place a spinal in the OR.  Pitocin off.    To OR when ready

## 2020-02-02 NOTE — Anesthesia Procedure Notes (Signed)
Spinal  Patient location during procedure: OR Start time: 02/02/2020 7:13 PM End time: 02/02/2020 7:25 PM Staffing Performed: anesthesiologist  Anesthesiologist: Duane Boston, MD Preanesthetic Checklist Completed: patient identified, IV checked, risks and benefits discussed, surgical consent, monitors and equipment checked, pre-op evaluation and timeout performed Spinal Block Patient position: sitting Prep: DuraPrep Patient monitoring: cardiac monitor, continuous pulse ox and blood pressure Approach: midline Location: L2-3 Injection technique: single-shot Needle Needle type: Pencan  Needle gauge: 24 G Needle length: 9 cm Additional Notes Functioning IV was confirmed and monitors were applied. Sterile prep and drape, including hand hygiene and sterile gloves were used. The patient was positioned and the spine was prepped. The skin was anesthetized with lidocaine.  Free flow of clear CSF was obtained prior to injecting local anesthetic into the CSF.  The spinal needle aspirated freely following injection.  The needle was carefully withdrawn.  The patient tolerated the procedure well.

## 2020-02-02 NOTE — Progress Notes (Signed)
Patient ID: Shelby Oconnor, female   DOB: 06-25-1986, 34 y.o.   MRN: [id number] Pt admitted with minimal contractions  afeb VSS FHR category 1  Cervix 50/2/-2 AROM clear  Pitocin per protocol

## 2020-02-02 NOTE — Anesthesia Preprocedure Evaluation (Signed)
Anesthesia Evaluation  Patient identified by MRN, date of birth, ID band Patient awake    Reviewed: Allergy & Precautions, NPO status , Patient's Chart, lab work & pertinent test results  Airway Mallampati: II  TM Distance: >3 FB Neck ROM: Full    Dental no notable dental hx.    Pulmonary neg pulmonary ROS,    Pulmonary exam normal breath sounds clear to auscultation       Cardiovascular negative cardio ROS Normal cardiovascular exam Rhythm:Regular Rate:Normal     Neuro/Psych PSYCHIATRIC DISORDERS Depression negative neurological ROS     GI/Hepatic Neg liver ROS, GERD  ,  Endo/Other  negative endocrine ROS  Renal/GU negative Renal ROS  negative genitourinary   Musculoskeletal negative musculoskeletal ROS (+)   Abdominal   Peds  Hematology negative hematology ROS (+)   Anesthesia Other Findings   Reproductive/Obstetrics (+) Pregnancy                             Anesthesia Physical Anesthesia Plan  ASA: II  Anesthesia Plan: Epidural   Post-op Pain Management:    Induction:   PONV Risk Score and Plan: Treatment may vary due to age or medical condition  Airway Management Planned: Natural Airway  Additional Equipment:   Intra-op Plan:   Post-operative Plan:   Informed Consent: I have reviewed the patients History and Physical, chart, labs and discussed the procedure including the risks, benefits and alternatives for the proposed anesthesia with the patient or authorized representative who has indicated his/her understanding and acceptance.       Plan Discussed with: Anesthesiologist  Anesthesia Plan Comments: (Patient identified. Risks, benefits, options discussed with patient including but not limited to bleeding, infection, nerve damage, paralysis, failed block, incomplete pain control, headache, blood pressure changes, nausea, vomiting, reactions to medication, itching,  and post partum back pain. Confirmed with bedside nurse the patient's most recent platelet count. Confirmed with the patient that they are not taking any anticoagulation, have any bleeding history or any family history of bleeding disorders. Patient expressed understanding and wishes to proceed. All questions were answered. )        Anesthesia Quick Evaluation

## 2020-02-03 LAB — CBC
HCT: 34 % — ABNORMAL LOW (ref 36.0–46.0)
Hemoglobin: 11.3 g/dL — ABNORMAL LOW (ref 12.0–15.0)
MCH: 29.4 pg (ref 26.0–34.0)
MCHC: 33.2 g/dL (ref 30.0–36.0)
MCV: 88.5 fL (ref 80.0–100.0)
Platelets: 255 10*3/uL (ref 150–400)
RBC: 3.84 MIL/uL — ABNORMAL LOW (ref 3.87–5.11)
RDW: 14.5 % (ref 11.5–15.5)
WBC: 21.4 10*3/uL — ABNORMAL HIGH (ref 4.0–10.5)
nRBC: 0 % (ref 0.0–0.2)

## 2020-02-03 LAB — ABO/RH: ABO/RH(D): O NEG

## 2020-02-03 NOTE — Progress Notes (Signed)
Subjective: Postpartum Day 1: Cesarean Delivery Patient reports tolerating PO and + flatus.  Ambulated x 2  Objective: Vital signs in last 24 hours: Temp:  [97.9 F (36.6 C)-99.9 F (37.7 C)] 98.1 F (36.7 C) (03/06 0830) Pulse Rate:  [74-143] 78 (03/06 0830) Resp:  [13-25] 18 (03/06 0830) BP: (103-169)/(53-149) 105/53 (03/06 0830) SpO2:  [95 %-100 %] 99 % (03/06 0200)  Physical Exam:  General: alert and cooperative Lochia: appropriate Uterine Fundus: firm Incision: C/D/I   Recent Labs    02/02/20 0851 02/03/20 0538  HGB 12.9 11.3*  HCT 38.8 34.0*    Assessment/Plan: Status post Cesarean section. Doing well postoperatively.  Continue current care. D/c foley Logan Bores 02/03/2020, 11:14 AM

## 2020-02-03 NOTE — Lactation Note (Signed)
This note was copied from a baby's chart. Lactation Consultation Note  Patient Name: Shelby Oconnor [id number] Date: 02/03/2020 Reason for consult: Initial assessment;Term P2, 35 hour female infant. Mom hx: C/S, depression with no medication and family history of trisomy 12. Infant had one void and 3 stools since birth. Per mom, she has DEBP that she has brought from home. Tools given: breast shells to wear in bra during the day and hand pump to pre-pump breast prior to latching infant,  due to mom having wide space flat nipples. Per mom, infant been latching but not sustaining latch he has been on and off breast. Per mom, she tried breastfeeding her 35 year old daughter for two months but stopped due to latch difficulties and being to tired at night to breast feed infant. LC had mom do breast stimulation and hand express a small amount of colostrum out of breast prior to latching infant on her right breast using the football hold position. Infant latch without difficulty and sustained latch, infant was still breastfeeding after 15 minutes when LC left the room. Mom will breastfeed according to hunger cues, 8 to 12 times within 24 hours, on demand, not exceed 3 hours without breastfeeding infant.  Mom knows to wear breast shells in bra during the day and not sleep in them day or night. Mom knows to pre-pump breast with hand pump prior to latching infant at breast. Mom knows to call RN or [facility] if she needs assistance with latching infant at breast. Mom made aware of O/P services, breastfeeding support groups, community resources, and our phone # for post-discharge questions.     Maternal Data Formula Feeding for Exclusion: Yes Reason for exclusion: Mother's choice to formula and breast feed on admission Has patient been taught Hand Expression?: Yes Does the patient have breastfeeding experience prior to this delivery?: Yes  Feeding Feeding Type: Breast Fed  LATCH Score Latch: Grasps breast  easily, tongue down, lips flanged, rhythmical sucking.  Audible Swallowing: Spontaneous and intermittent  Type of Nipple: Flat  Comfort (Breast/Nipple): Soft / non-tender  Hold (Positioning): Assistance needed to correctly position infant at breast and maintain latch.  LATCH Score: 8  Interventions Interventions: Breast feeding basics reviewed;Breast compression;Hand pump;DEBP;Adjust position;Assisted with latch;Skin to skin;Support pillows;Position options;Breast massage;Hand express;Expressed milk;Pre-pump if needed;Shells  Lactation Tools Discussed/Used Tools: Shells Shell Type: Other (comment)(flat) WIC Program: No Pump Review: Setup, frequency, and cleaning;Milk Storage Initiated by:: Vicente Serene, IBCLC Date initiated:: 02/03/20   Consult Status Consult Status: Follow-up Date: 02/03/20 Follow-up type: In-patient    Vicente Serene 02/03/2020, 3:12 AM

## 2020-02-03 NOTE — Lactation Note (Signed)
This note was copied from a baby's chart. Lactation Consultation Note  Patient Name: Shelby Oconnor [id number] Date: 02/03/2020 Reason for consult: Follow-up assessment;Term  [phone] F/U visit with P2 mom, baby is now 23hrs old with 2% wt loss  LC entered room to find mom in bed with baby at her breast, states he just finished feeding. Mom states sometimes she thinks he latches well and other times not as well. Mom with concerns about if baby is getting enough. Mom questions using nipple shield to assist with latching.  Mom's left breast exposed and nipple appears healthy and pink in color, rounded and erect at rest. Mom has baby lying on two pillows in football hold position. Hold appears appropriate and praised mom for bringing baby to the level of her breast instead of leaning over onto baby. Baby has had 4 wet diapers since midnight. Reviewed expected output of newborn and signs of adequate hydration from exclusive breastfeeding re: wet diapers, satisfied with feedings/falls asleep on the breast, sleeps during feedings, weight loss minimal. Reassured mom that breastfeeding appears to be going well. Mom with breast shells and coconut oil at bedside; reviewed use of each.  Reviewed hand expression prior to latching. Discouraged mom from using nipple shield unless necessary and with guidance from [facility]. Reviewed alternating breasts and positions with each feeding. Reviewed IP lactation services and encouraged to call for assistance as needed.  Interventions Interventions: Breast massage;Hand express;Position options;Support pillows;Breast feeding basics reviewed;Expressed milk;Coconut oil;Shells  Consult Status Consult Status: Follow-up Date: 02/04/20 Follow-up type: In-patient    Cranston Neighbor 02/03/2020, 6:51 PM

## 2020-02-04 ENCOUNTER — Encounter (HOSPITAL_COMMUNITY): Payer: Self-pay | Admitting: Obstetrics and Gynecology

## 2020-02-04 MED ORDER — OXYCODONE HCL 5 MG PO TABS: 5.0000 mg | ORAL_TABLET | ORAL | 0 refills | Status: AC | PRN

## 2020-02-04 MED ORDER — ACETAMINOPHEN 325 MG PO TABS: 650.0000 mg | ORAL_TABLET | ORAL | 1 refills | Status: AC | PRN

## 2020-02-04 MED ORDER — IBUPROFEN 800 MG PO TABS: 800.0000 mg | ORAL_TABLET | Freq: Three times a day (TID) | ORAL | 0 refills | Status: AC

## 2020-02-04 NOTE — Progress Notes (Signed)
Subjective: Postpartum Day 2: Cesarean Delivery Patient reports tolerating PO, + flatus and no problems voiding.    Objective: Vital signs in last 24 hours: Temp:  [98 F (36.7 C)-98.7 F (37.1 C)] 98 F (36.7 C) (03/07 0544) Pulse Rate:  [73-87] 84 (03/07 0544) Resp:  [14-18] 14 (03/07 0544) BP: (111-127)/(66-78) 125/78 (03/07 0544) SpO2:  [98 %-100 %] 100 % (03/07 0544)  Physical Exam:  General: alert and cooperative Lochia: appropriate Uterine Fundus: firm Incision: C/D/I DVT Evaluation: No evidence of DVT seen on physical exam.  Recent Labs    02/02/20 0851 02/03/20 0538  HGB 12.9 11.3*  HCT 38.8 34.0*    Assessment/Plan: Status post Cesarean section. Doing well postoperatively.  Discharge home with standard precautions and return to office in 2 weeks.  Logan Bores 02/04/2020, 10:23 AM

## 2020-02-04 NOTE — Progress Notes (Signed)
CSW received consult for hx of Depression.  CSW met with MOB to offer support and complete assessment.    CSW met with MOB at bedside. Infant Brycen and FOB were present at arrival, however, FOB left room to offer MOB privacy during assessment. Infant remained in room with MOB as he was breastfeeding. MOB was pleasant and engaged throughout visit.   When CSW asked MOB to identify current mood, MOB stated "I feel good". MOB denied any hx of depression since grade school. MOB reported brief concerns with mood during prenatal care, however, she associated it with the combination of a work-related issue and pregnancy hormones. MOB explained she removed herself from the situation at work and mood immeditaly improved. "I decided I was pregnant, hormonal, and the situation did not have anything to do with me." MOB denied any postpartum after birth of 35 year old son. MOB denied any SI, HI, or domestic violence. MOB identified support system as FOB, sister, mom, and friends.   CSW provided education regarding the baby blues period vs. perinatal mood disorders, discussed treatment and gave resources for mental health follow up if concerns arise.  CSW recommends self-evaluation during the postpartum time period using the New Mom Checklist from Postpartum Progress and encouraged MOB to contact a medical professional if symptoms are noted at any time. MOB expressed understanding and denied any additional questions.     CSW provided review of Sudden Infant Death Syndrome (SIDS) precautions. MOB confirmed having all needed items for baby including car seat and bassinet. MOB stated bassinet will be baby's safe sleeping area.    CSW identifies no further need for intervention and no barriers to discharge at this time.  Cordarius Benning D. Lissa Morales, MSW, [facility] Clinical Social Worker [phone]

## 2020-02-04 NOTE — Lactation Note (Signed)
This note was copied from a baby's chart. Lactation Consultation Note  Patient Name: Shelby Oconnor [id number] Date: 02/04/2020 Reason for consult: Follow-up assessment;Infant weight loss Baby 40 hours old/9% weight loss.  Mom reports that baby cluster fed last night.  Baby is currently on the breast.  Latch is deep and baby actively sucking.  A few swallows identified.  Gentle chin tug done to bring bottom lip out.  Mom is doing good breast massage and compression during feeding.  Recommended post pumping due to weight loss.  Discussed milk coming to volume and the prevention and treatment of engorgement.  Mom has a breast pump for home use.  Questions answered.  Reviewed outpatient services and encouraged to call prn.  Maternal Data    Feeding Feeding Type: Breast Fed  LATCH Score Latch: Grasps breast easily, tongue down, lips flanged, rhythmical sucking.  Audible Swallowing: A few with stimulation  Type of Nipple: Everted at rest and after stimulation  Comfort (Breast/Nipple): Filling, red/small blisters or bruises, mild/mod discomfort  Hold (Positioning): No assistance needed to correctly position infant at breast.  LATCH Score: 8  Interventions    Lactation Tools Discussed/Used     Consult Status Consult Status: Complete Follow-up type: Call as needed    Ave Filter 02/04/2020, 12:32 PM

## 2020-02-06 NOTE — Discharge Summary (Signed)
OB Discharge Summary     Patient Name: Shelby Oconnor DOB: 1986-08-15 MRN: [id number]  Date of admission: 02/02/2020 Delivering MD: Paula Compton   Date of discharge: 02/06/2020  Admitting diagnosis: Indication for care in labor and delivery, antepartum [O75.9] Status post primary low transverse cesarean section [Z98.891] Intrauterine pregnancy: [redacted]w[redacted]d     Secondary diagnosis:  Active Problems:   Indication for care in labor and delivery, antepartum   Status post primary low transverse cesarean section  Additional problems: Arrest of descent     Discharge diagnosis: Term Pregnancy Delivered                                                                                                Post partum procedures:none  Augmentation: AROM and Pitocin  Complications: None  Hospital course:  Onset of Labor With Unplanned C/S  34 y.o. yo G2P2002 at [redacted]w[redacted]d was admitted for IOL on 02/02/2020. Patient had a labor course significant for arrest of descent. Membrane Rupture Time/Date: 9:56 AM ,02/02/2020   The patient went for cesarean section due to Arrest of Descent, and delivered a Viable infant,02/02/2020  Details of operation can be found in separate operative note. Patient had an uncomplicated postpartum course.  She is ambulating,tolerating a regular diet, passing flatus, and urinating well.  Patient is discharged home in stable condition 02/06/20.  Physical exam  Vitals:   02/03/20 1125 02/03/20 1620 02/03/20 2114 02/04/20 0544  BP: 111/75 118/66 127/71 125/78  Pulse: 73 79 87 84  Resp: 18 18 16 14   Temp: 98.1 F (36.7 C) 98.4 F (36.9 C) 98.7 F (37.1 C) 98 F (36.7 C)  TempSrc:  Oral Oral Oral  SpO2:  98% 99% 100%  Weight:      Height:       General: alert and cooperative Lochia: appropriate Uterine Fundus: firm Incision: Dressing is clean, dry, and intact DVT Evaluation: No evidence of DVT seen on physical exam. Labs: Lab Results  Component Value Date   WBC 21.4 (H)  02/03/2020   HGB 11.3 (L) 02/03/2020   HCT 34.0 (L) 02/03/2020   MCV 88.5 02/03/2020   PLT 255 02/03/2020   CMP Latest Ref Rng & Units 02/02/2020  Glucose 70 - 99 mg/dL 110(H)  BUN 6 - 20 mg/dL 9  Creatinine 0.44 - 1.00 mg/dL 0.61  Sodium 135 - 145 mmol/L 136  Potassium 3.5 - 5.1 mmol/L 3.6  Chloride 98 - 111 mmol/L 105  CO2 22 - 32 mmol/L 19(L)  Calcium 8.9 - 10.3 mg/dL 9.3  Total Protein 6.5 - 8.1 g/dL 6.0(L)  Total Bilirubin 0.3 - 1.2 mg/dL 0.3  Alkaline Phos 38 - 126 U/L 180(H)  AST 15 - 41 U/L 25  ALT 0 - 44 U/L 27    Discharge instruction: per After Visit Summary and "Baby and Me Booklet".  After visit meds:  Allergies as of 02/04/2020   No Known Allergies     Medication List    TAKE these medications   acetaminophen 325 MG tablet Commonly known as: TYLENOL Take 2 tablets (650 mg total) by mouth every  4 (four) hours as needed for mild pain (temperature > 101.5.).   b complex vitamins tablet Take 1 tablet by mouth daily.   ibuprofen 800 MG tablet Commonly known as: ADVIL Take 1 tablet (800 mg total) by mouth every 8 (eight) hours.   loratadine 10 MG tablet Commonly known as: CLARITIN Take 10 mg by mouth daily as needed for allergies.   magnesium hydroxide 400 MG/5ML suspension Commonly known as: MILK OF MAGNESIA Take 45-60 mLs by mouth daily as needed for mild constipation.   oxyCODONE 5 MG immediate release tablet Commonly known as: Oxy IR/ROXICODONE Take 1-2 tablets (5-10 mg total) by mouth every 4 (four) hours as needed for moderate pain.   pantoprazole 20 MG tablet Commonly known as: PROTONIX Take 20 mg by mouth at bedtime.   prenatal multivitamin Tabs tablet Take 1 tablet by mouth daily at 12 noon.   Vitamin D 125 MCG (5000 UT) Caps Take 1 capsule by mouth daily.       Diet: routine diet  Activity: Advance as tolerated. Pelvic rest for 6 weeks.   Outpatient follow up:2 weeks Follow up Appt:No future appointments. Follow up Visit:No  follow-ups on file.  Postpartum contraception: Undecided  Newborn Data: Live born female  Birth Weight: 8 lb 14 oz (4025 g) APGAR: 8, 9  Newborn Delivery   Birth date/time: 02/02/2020 19:45:00 Delivery type: C-Section, Low Transverse Trial of labor: Yes C-section categorization: Primary      Baby Feeding: Breast Disposition:home with mother   02/06/2020 Logan Bores, MD

## 2020-02-19 ENCOUNTER — Telehealth (HOSPITAL_COMMUNITY): Payer: Self-pay | Admitting: Lactation Services

## 2020-02-19 NOTE — Telephone Encounter (Signed)
Shelby Oconnor has 23 week old baby that is exclusively breastfeeding from right breast and EBM from left breast by bottle, Baby gained 1 lb since discharge.    Complaining of left breast pain, tenderness, reddened area since Friday (3 days) and now feeling achy, chills and lethargic.  Taking Ibuprofen.  Will check temp after 4-6 hrs after taking ibuprofen. Baby not latching well on left breast, nipple soreness.  Mom is pumping only on left side.  Encouraged Mom to continue pumping regularly to empty the left breast.  Mom encouraged to apply warm, moist compresses on left breast and rest, increase po fluids.  Advised Mom to call her OB due to her S/S of mastitis Message sent to clinic for OP lactation appointment.

## 2020-02-27 ENCOUNTER — Ambulatory Visit: Payer: Self-pay

## 2020-02-27 NOTE — Lactation Note (Signed)
This note was copied from a baby's chart. Lactation Consultation Note  Patient Name: Shelby Oconnor [id number] Date: 02/27/2020    02/27/2020  Name: Shelby Oconnor MRN: [id number] Date of Birth: 02/02/2021 Gestational Age: Gestational Age: [redacted]w[redacted]d Birth Weight: 142 oz Weight today:  Weight: (!) 10 lb 0.1 oz (54109 g)   5 week old term infant presents today with mom for feeding assessment. Infant had an ounce of formula prior to appt.   Infant has gained 903 grams in the last 23 days with an average daily weight gain of 39 grams a day.   Infant self awakens to feed every 2-2.5 hours and feeds at the breast, taking both breasts with each feeding. He will feed for 10-15 minutes on each side.   Mom has had Mastitis this week in her left breast. She is noting some thickness to the breast tissue with less volume since Mastitis. Reviewed this is not uncommon to see. Mom has gotten Milk Flow Plus chocolate Drink to try. Reviewed Fenugreek side effects and dosages.   Mom pumping about 3 times with a Free Me [employer] and getting about 3 ounces per pumping. She has noticed a decrease since getting Mastitis. She started Antibiotics TID last Wednesday and is not eating as well. She reports she cannot take with food and has to wait 2 hours after taking to eat. Reviewed supply and demand and importance of pumping when infant is getting a bottle. Mom was pumping the left breast more, she reports infant is now able to latch again and she has decreased the pumping since.    Infant with thick labial frenulum that inserts at the bottom of the gum ridge. Upper lip tight with flanging. Infant with sucking blister to upper center lip. Infant with posterior lingual frenulum noted. Infant gaggy on gloved finger and would not suckle on gloved finger. Infant with good tongue extension and lateralization. He has some decreased mid tongue elevation. Mom with pain with feeding and nipple compressed post feeding to the  left breast. Nipple rounded and not compressed on the right breast and not pain noted. Infant sleepy at the breast and needs stimulation to maintain suckling at the breast. Infant did better on the right breast. Infant with some clicking on the right breast at beginning of the feeding. Reviewed tongue and lip restrictions and how they can effect milk supply and milk transfer. Website and local provider information given.   Discussed that since infant only having difficulty latching to one breast that he may have a tight neck muscle. He is tending to want to turn to the right side. Mom to look into CST and Chiropractic care. Reviewed making sure left breast in particular is drained well to prevent further Mastitis and plugged ducts.   Mom reports infant drinks out of the bottle fast. They are pace bottle feeding infant. Infant is less gassy when he is BF vs bottle feeding.   Infant to follow up with Dr. Corinna Lines on May 6.  Infant to follow up with Lactation in 2 weeks.      General Information: Mother's reason for visit: Feeding assessment Consult: Initial Lactation consultant: Nonah Mattes RN,IBCLC Breastfeeding experience: Latching with each feeding and supplementing   Maternal medications: Pre-natal vitamin, Other(Sunflower Lecithin, stopped with Mastitis)  Breastfeeding History: Frequency of breast feeding: every 2-2.5 hours Duration of feeding: 20-30 minutes  Supplementation: Supplement method: bottle(Avent, 0 nipple, no choking or drooling) Brand: Similac Formula volume: 1.5-4 ounces with each feeding Formula  frequency: every 2-2.5 hours   Breast milk volume: 1.5-4 ounces Breast milk frequency: 2-3 times a day   Pump type: Other(Medela Free Me) Pump frequency: 3 x a day Pump volume: 3 ounces  Infant Output Assessment: Voids per 24 hours: 8+ Urine color: Clear yellow Stools per 24 hours: 2 Stool color: Yellow  Breast Assessment: Breast: Soft, Compressible Nipple:  Erect Pain level: 6(on the left breast, no pain to the right breast) Pain interventions: Bra, Breast pump  Feeding Assessment: Infant oral assessment: Variance Infant oral assessment comment: see note Positioning: Cross cradle(left breast, 15 minutes) Latch: 1 - Repeated attempts needed to sustain latch, nipple held in mouth throughout feeding, stimulation needed to elicit sucking reflex. Audible swallowing: 1 - A few with stimulation Type of nipple: 2 - Everted at rest and after stimulation Comfort: 1 - Filling, red/small blisters or bruises, mild/mod discomfort Hold: 1 - Assistance needed to correctly position infant at breast and maintain latch LATCH score: 6 Latch assessment: Deep Lips flanged: Yes Suck assessment: Displays both Tools: Bottle Pre-feed weight: 4538 grams Post feed weight: 4556 grams Amount transferred: 18 ml Amount supplemented: 0  Additional Feeding Assessment: Infant oral assessment: Variance Infant oral assessment comment: see note Positioning: Cross cradle(right breast, 15 minutes) Latch: 2 - Grasps breast easily, tongue down, lips flanged, rhythmical sucking. Audible swallowing: 2 - Spontaneous and intermittent Type of nipple: 2 - Everted at rest and after stimulation Comfort: 2 - Soft/non-tender Hold: 2 - No assistance needed to correctly position infant at breast LATCH score: 10 Latch assessment: Deep Lips flanged: Yes Suck assessment: Displays both   Pre-feed weight: 4556 grams Post feed weight: 4596 grams Amount transferred: 40 ml Amount supplemented: mom to supplement  Totals: Total amount transferred: 58 ml Total supplement given: mom to supplement Total amount pumped post feed: did not pump   Plan:  1. Offer infant the breast with feeding cues 2. Stimulate to keep infant awake with feeding as needed  3. Massage/compress breast with feeding as needed to keep infant active at the breast 4. Offer both breasts with each feeding 5. Empty  the first breast before offering the second breast 6. When feeding a bottle feed using the paced bottle feeding method (video on [email or web address].[email or web address]) 7. Offer infant a bottle of pumped milk or formula after breast feeding if he is still cueing to feed 8. Infant needs about 84-114 ml (3-4 ounces) for 8 feeds a day or 675-900 ml (23-30 ounces) in 24 hours. Infant may take more or less depending on how often he feeds. Feed infant until he is satisfied.  9. Would recommend that you pump about 6 times a day or anytime infant is getting a bottle of supplement to protect and promote milk supply.  Make sure left breast if drained well. Rotate positions on both breasts to help with drainage of the breasts 10. Call [place] to ask about a pump. If not able to get they are rented through the [place] at the [facility] and [facility].  11. Dr. Harlow Asa is a Chiropractor in [place] that sees babies [phone] 65. A Cranio Sacral therapist in [place] is Roylene Reason [phone] 13. Keep up the good work 75. Thank you for allowing me to assist you today 15. Please call with any questions or concerns as needed [phone] 16. Follow up with Lactation in 2 weeks [place], [employer]  Shelby Oconnor 02/27/2020, 10:33 AM

## 2020-03-11 ENCOUNTER — Ambulatory Visit: Payer: Self-pay

## 2020-03-11 NOTE — Lactation Note (Signed)
This note was copied from a baby's chart. 03/11/2020  Name: Floyce Wice MRN: [id number] Date of Birth: 02/02/2021 Gestational Age: Gestational Age: [redacted]w[redacted]d Birth Weight: 142 oz Weight today:  Weight: 11 lb 4.3 oz (5111 g)   General Information: Mother's reason for visit: Latch check and weighted feeding Consult: Follow-up Lactation consultant: Gaylan Gerold, MSN, CNM, IBCLC) Breastfeeding experience: Breastfed first child (71yrs ago)   Maternal medications: Pre-natal vitamin  Breastfeeding History: Frequency of breast feeding: q2-3hrs during the day, q4-5hrs at night Duration of feeding: 15-16min  Supplementation: Supplement method: bottle   Formula volume: 2-4oz Formula frequency: 4x/day Total formula volume per day: 8-12oz/day Breast milk volume: 1-2oz Breast milk frequency: 1-2x/day Total breast milk volume per day: 2-4oz/day Pump type: Spectra Pump frequency: 3-4x mostly in the afternoon Pump volume: 3-4oz  Infant Output Assessment: Voids per 24 hours: 10+ Urine color: Clear yellow Stools per 24 hours: 8+ Stool color: Yellow  Breast Assessment: Breast: Soft, Compressible(Not widely spaced or tubular, but breasts are smaller and triangular shaped.) Nipple: Erect, Other(thickened montgomery gland on right areola, sore and red montgomery gland on left areola) Pain level: 1 Pain interventions: Expressed breast milk(advised EBM to left areola)  Feeding Assessment: Infant oral assessment: WNL Infant oral assessment comment: Visualized lip tie, much looser today and lip flanges well. Thick tongue frenulum felt but also does not impede tongue mobility Positioning: Cross cradle(left) Latch: 2 - Grasps breast easily, tongue down, lips flanged, rhythmical sucking. Audible swallowing: 2 - Spontaneous and intermittent(milk let down quickly once, no further letdowns despite baby remaining latched) Type of nipple: 2 - Everted at rest and after stimulation Comfort: 2 -  Soft/non-tender Hold: 1 - Assistance needed to correctly position infant at breast and maintain latch LATCH score: 9 Latch assessment: Deep Lips flanged: Yes Suck assessment: Displays both   Pre-feed weight: 11lbs 4.3oz Post feed weight: 11lbs 4.8oz Amount transferred: .5oz   Lactation Note: Jazlynne reports that mastitis has resolved and her milk production seems to have stabilized - both breasts now produce about the same amount (just within the last couple of days). Heide Spark is latching much better, and is satiated after most feedings overnight and through the morning. By afternoon/evening, he will eat for 15-29min then seem hungry but fussy at the breast so she gives him EBM or formula in a bottle. She is drinking oat milk, taking some fenugreek, and trying to eat oatmeal. She expresses frustration at trying to keep up with pumping, all the feedings, and all the various supplements. He has gained 1.4lbs since his last weight for an average of 1.5oz/day.   Performed oral exam prior to weight - his lip/tongue restrictions have eased some. His lip is freely mobile and he was able to maintain a latch on my gloved finger with appropriate undulations.   Havanna has a small reddened and tender area on her left areola, consistent with an inflamed Montgomery gland. Advised EBM to the area and monitor for continued redness, irritation and worsening pain.  Brycen latched to the left side with minimal assistance. Abbigaile reported no pain from the latch. He immediately began gulping and continued to do so for a few minutes before settling into a non-nutritive pattern. Kimaria said this is what happens on the pump. She has one letdown as soon as she starts the pump, but nothing after that. Brycen took 0.5oz with that one letdown. Switched to the other side and he latched with some pinching on that side, adjusted and Hargun reported  some relief. He nursed for 53min with no letdown, unlatched and stopped giving  cues.  Discussed various ways to manage his appetite with her supply, she would like to keep doing what she has been which is to latch overnight and in the morning, giving more bottles in the afternoon/evening. She is going to switch to [employer] supplements to avoid having to try so many different foods/drinks/teas.  Follow up PRN.  Plan: 1. Continue current feeding pattern since he is gaining well and it works for their family. 2. Switch to Legendairy Milk Supplement 3. Follow up as needed.  Gabriel Carina, MSN, CNM, IBCLC 03/11/2020, 1:28 PM

## 2020-05-30 DIAGNOSIS — Z419 Encounter for procedure for purposes other than remedying health state, unspecified: Secondary | ICD-10-CM | POA: Diagnosis not present

## 2020-06-30 DIAGNOSIS — Z419 Encounter for procedure for purposes other than remedying health state, unspecified: Secondary | ICD-10-CM | POA: Diagnosis not present

## 2020-07-31 DIAGNOSIS — Z419 Encounter for procedure for purposes other than remedying health state, unspecified: Secondary | ICD-10-CM | POA: Diagnosis not present

## 2020-08-30 DIAGNOSIS — Z419 Encounter for procedure for purposes other than remedying health state, unspecified: Secondary | ICD-10-CM | POA: Diagnosis not present

## 2020-09-30 DIAGNOSIS — Z419 Encounter for procedure for purposes other than remedying health state, unspecified: Secondary | ICD-10-CM | POA: Diagnosis not present

## 2020-10-30 DIAGNOSIS — Z419 Encounter for procedure for purposes other than remedying health state, unspecified: Secondary | ICD-10-CM | POA: Diagnosis not present

## 2020-11-30 DIAGNOSIS — Z419 Encounter for procedure for purposes other than remedying health state, unspecified: Secondary | ICD-10-CM | POA: Diagnosis not present

## 2020-12-31 DIAGNOSIS — Z419 Encounter for procedure for purposes other than remedying health state, unspecified: Secondary | ICD-10-CM | POA: Diagnosis not present

## 2021-01-06 ENCOUNTER — Emergency Department (HOSPITAL_COMMUNITY): Payer: No Typology Code available for payment source

## 2021-01-06 ENCOUNTER — Encounter (HOSPITAL_COMMUNITY): Payer: Self-pay | Admitting: *Deleted

## 2021-01-06 ENCOUNTER — Emergency Department (HOSPITAL_COMMUNITY)
Admission: EM | Admit: 2021-01-06 | Discharge: 2021-01-06 | Disposition: A | Discharge status: Home / Self Care | Payer: No Typology Code available for payment source | Attending: Emergency Medicine | Admitting: Emergency Medicine

## 2021-01-06 ENCOUNTER — Other Ambulatory Visit: Payer: Self-pay

## 2021-01-06 DIAGNOSIS — Y990 Civilian activity done for income or pay: Secondary | ICD-10-CM | POA: Insufficient documentation

## 2021-01-06 DIAGNOSIS — M546 Pain in thoracic spine: Secondary | ICD-10-CM | POA: Insufficient documentation

## 2021-01-06 DIAGNOSIS — X500XXA Overexertion from strenuous movement or load, initial encounter: Secondary | ICD-10-CM | POA: Insufficient documentation

## 2021-01-06 LAB — D-DIMER, QUANTITATIVE: D-Dimer, Quant: <0.27 ug/mL-FEU (ref 0.00–0.50)

## 2021-01-06 MED ORDER — DICLOFENAC SODIUM 75 MG PO TBEC: 75.0000 mg | DELAYED_RELEASE_TABLET | Freq: Two times a day (BID) | ORAL | 0 refills | Status: AC

## 2021-01-06 MED ORDER — HYDROCODONE-ACETAMINOPHEN 5-325 MG PO TABS
2.0000 | ORAL_TABLET | Freq: Once | ORAL | Status: AC
  Administered 2021-01-06: 2 via ORAL
  Filled 2021-01-06: qty 2

## 2021-01-06 MED ORDER — METHOCARBAMOL 500 MG PO TABS: 500.0000 mg | ORAL_TABLET | Freq: Four times a day (QID) | ORAL | 0 refills | Status: AC

## 2021-01-06 NOTE — ED Notes (Signed)
Patient verbalizes understanding of discharge instructions. Opportunity for questioning and answers were provided. Armband removed by staff, pt discharged from ED ambulatory.   

## 2021-01-06 NOTE — ED Triage Notes (Signed)
Pt says that she lifted a bag of frozen bread around midnight last night at work and felt a sharp pain in the middle of her back, now hurts to take a deep breath in her chest and back and pain with rotation of torso.  She used a heating pad and used ibuprofen without relief

## 2021-01-06 NOTE — ED Provider Notes (Addendum)
[facility] EMERGENCY DEPARTMENT Provider Note   CSN: [id number] Arrival date & time: 01/06/21  1906     History Chief Complaint  Patient presents with  . Back Pain    Shelby Oconnor is a 35 y.o. female.  The history is provided by the patient. No language interpreter was used.  Back Pain Location:  Thoracic spine Quality:  Aching Pain severity:  Moderate Pain is:  Same all the time Duration:  1 day Timing:  Constant Chronicity:  New Relieved by:  Nothing Worsened by:  Nothing Ineffective treatments:  None tried Associated symptoms: no numbness   Pt complains of pain that started when lifting at work yesterday. Pt was lifting from waist height up. Pt reports pain radiated from herr back around to her chest.  Pt reports a pain in her mid chest      Past Medical History:  Diagnosis Date  . Depression   . Fibroid   . GERD (gastroesophageal reflux disease)     Patient Active Problem List   Diagnosis Date Noted  . Indication for care in labor and delivery, antepartum 02/02/2020  . Status post primary low transverse cesarean section 02/02/2020    Past Surgical History:  Procedure Laterality Date  . CESAREAN SECTION N/A 02/02/2020   Procedure: CESAREAN SECTION;  Surgeon: Paula Compton, MD;  Location: [facility];  Service: Obstetrics;  Laterality: N/A;     OB History    Gravida  2   Para  2   Term  2   Preterm      AB      Living  2     SAB      IAB      Ectopic      Multiple  0   Live Births  2           Family History  Problem Relation Age of Onset  . Cancer Mother   . Anxiety disorder Sister   . Anxiety disorder Daughter   . Diabetes Maternal Aunt   . Depression Maternal Aunt   . Cancer Paternal Grandmother   . Diabetes Paternal Grandmother     Social History   Tobacco Use  . Smoking status: Never Smoker  . Smokeless tobacco: Never Used  Vaping Use  . Vaping Use: Never used  Substance Use Topics  . Alcohol  use: Not Currently  . Drug use: Never    Home Medications Prior to Admission medications   Medication Sig Start Date End Date Taking? Authorizing Provider  acetaminophen (TYLENOL) 325 MG tablet Take 2 tablets (650 mg total) by mouth every 4 (four) hours as needed for mild pain (temperature > 101.5.). 02/04/20   Paula Compton, MD  b complex vitamins tablet Take 1 tablet by mouth daily.    [provider]  Cholecalciferol (VITAMIN D) 125 MCG (5000 UT) CAPS Take 1 capsule by mouth daily.    [provider]  ibuprofen (ADVIL) 800 MG tablet Take 1 tablet (800 mg total) by mouth every 8 (eight) hours. 02/04/20   Paula Compton, MD  loratadine (CLARITIN) 10 MG tablet Take 10 mg by mouth daily as needed for allergies.    [provider]  magnesium hydroxide (MILK OF MAGNESIA) 400 MG/5ML suspension Take 45-60 mLs by mouth daily as needed for mild constipation.    [provider]  oxyCODONE (OXY IR/ROXICODONE) 5 MG immediate release tablet Take 1-2 tablets (5-10 mg total) by mouth every 4 (four) hours as  needed for moderate pain. 02/04/20   Paula Compton, MD  pantoprazole (PROTONIX) 20 MG tablet Take 20 mg by mouth at bedtime.    [provider]  Prenatal Vit-Fe Fumarate-FA (PRENATAL MULTIVITAMIN) TABS tablet Take 1 tablet by mouth daily at 12 noon.    [provider]    Allergies    Patient has no known allergies.  Review of Systems   Review of Systems  Musculoskeletal: Positive for back pain.  Neurological: Negative for numbness.  All other systems reviewed and are negative.   Physical Exam Updated Vital Signs BP (!) 139/103 (BP Location: Right Arm) Comment: PA aware  Pulse 76   Temp 98.2 F (36.8 C) (Oral)   Resp 14   LMP 12/24/2020   SpO2 100%   Physical Exam Vitals and nursing note reviewed.  Constitutional:      Appearance: She is well-developed and well-nourished.  HENT:     Head: Normocephalic.  Eyes:      Extraocular Movements: EOM normal.  Cardiovascular:     Rate and Rhythm: Normal rate and regular rhythm.  Pulmonary:     Effort: Pulmonary effort is normal.  Abdominal:     General: There is no distension.  Musculoskeletal:        General: Normal range of motion.     Cervical back: Normal range of motion.  Neurological:     General: No focal deficit present.     Mental Status: She is alert and oriented to person, place, and time.  Psychiatric:        Mood and Affect: Mood and affect and mood normal.     ED Results / Procedures / Treatments   Labs (all labs ordered are listed, but only abnormal results are displayed) Labs Reviewed  D-DIMER, QUANTITATIVE (NOT AT [facility])    EKG None  Radiology DG Chest 2 View  Result Date: 01/06/2021 CLINICAL DATA:  Chest and back pain EXAM: CHEST - 2 VIEW COMPARISON:  None. FINDINGS: The heart size and mediastinal contours are within normal limits. Both lungs are clear. The visualized skeletal structures are unremarkable. IMPRESSION: No active cardiopulmonary disease. Electronically Signed   By: Dahlia Bailiff MD   On: 01/06/2021 19:40    Procedures Procedures   Medications Ordered in ED Medications  HYDROcodone-acetaminophen (NORCO/VICODIN) 5-325 MG per tablet 2 tablet (has no administration in time range)    ED Course  I have reviewed the triage vital signs and the nursing notes.  Pertinent labs & imaging results that were available during my care of the patient were reviewed by me and considered in my medical decision making (see chart for details).    MDM Rules/Calculators/A&P                          MDM chest xray normal.  EKG no acute.  ddimer pending Final Clinical Impression(s) / ED Diagnoses Final diagnoses:  Acute thoracic back pain, unspecified back pain laterality    Rx / DC Orders ED Discharge Orders         Ordered    methocarbamol (ROBAXIN) 500 MG tablet  4 times daily        01/06/21 2150    diclofenac  (VOLTAREN) 75 MG EC tablet  2 times daily        01/06/21 2150        An After Visit Summary was printed and given to the patient.    Fransico Meadow, [place]  01/06/21 2151    Fransico Meadow, PA-C 01/06/21 2152    Daleen Bo, MD 01/08/21 1351

## 2021-01-06 NOTE — Discharge Instructions (Addendum)
Follow up with your Physician for recheck  

## 2021-01-28 DIAGNOSIS — Z419 Encounter for procedure for purposes other than remedying health state, unspecified: Secondary | ICD-10-CM | POA: Diagnosis not present

## 2021-02-28 DIAGNOSIS — Z419 Encounter for procedure for purposes other than remedying health state, unspecified: Secondary | ICD-10-CM | POA: Diagnosis not present

## 2021-03-30 DIAGNOSIS — Z419 Encounter for procedure for purposes other than remedying health state, unspecified: Secondary | ICD-10-CM | POA: Diagnosis not present

## 2021-04-30 DIAGNOSIS — Z419 Encounter for procedure for purposes other than remedying health state, unspecified: Secondary | ICD-10-CM | POA: Diagnosis not present

## 2021-05-30 DIAGNOSIS — Z419 Encounter for procedure for purposes other than remedying health state, unspecified: Secondary | ICD-10-CM | POA: Diagnosis not present

## 2021-06-30 DIAGNOSIS — Z419 Encounter for procedure for purposes other than remedying health state, unspecified: Secondary | ICD-10-CM | POA: Diagnosis not present

## 2021-07-31 DIAGNOSIS — Z419 Encounter for procedure for purposes other than remedying health state, unspecified: Secondary | ICD-10-CM | POA: Diagnosis not present

## 2021-08-30 DIAGNOSIS — Z419 Encounter for procedure for purposes other than remedying health state, unspecified: Secondary | ICD-10-CM | POA: Diagnosis not present

## 2021-09-30 DIAGNOSIS — Z419 Encounter for procedure for purposes other than remedying health state, unspecified: Secondary | ICD-10-CM | POA: Diagnosis not present

## 2021-10-30 DIAGNOSIS — Z419 Encounter for procedure for purposes other than remedying health state, unspecified: Secondary | ICD-10-CM | POA: Diagnosis not present

## 2021-11-16 IMAGING — CR DG CHEST 2V
2 series · 2 of 2 positions shown · non-contrast
Comparison: None.

CLINICAL DATA: Chest and back pain

EXAM:
CHEST - 2 VIEW

[chest pa]
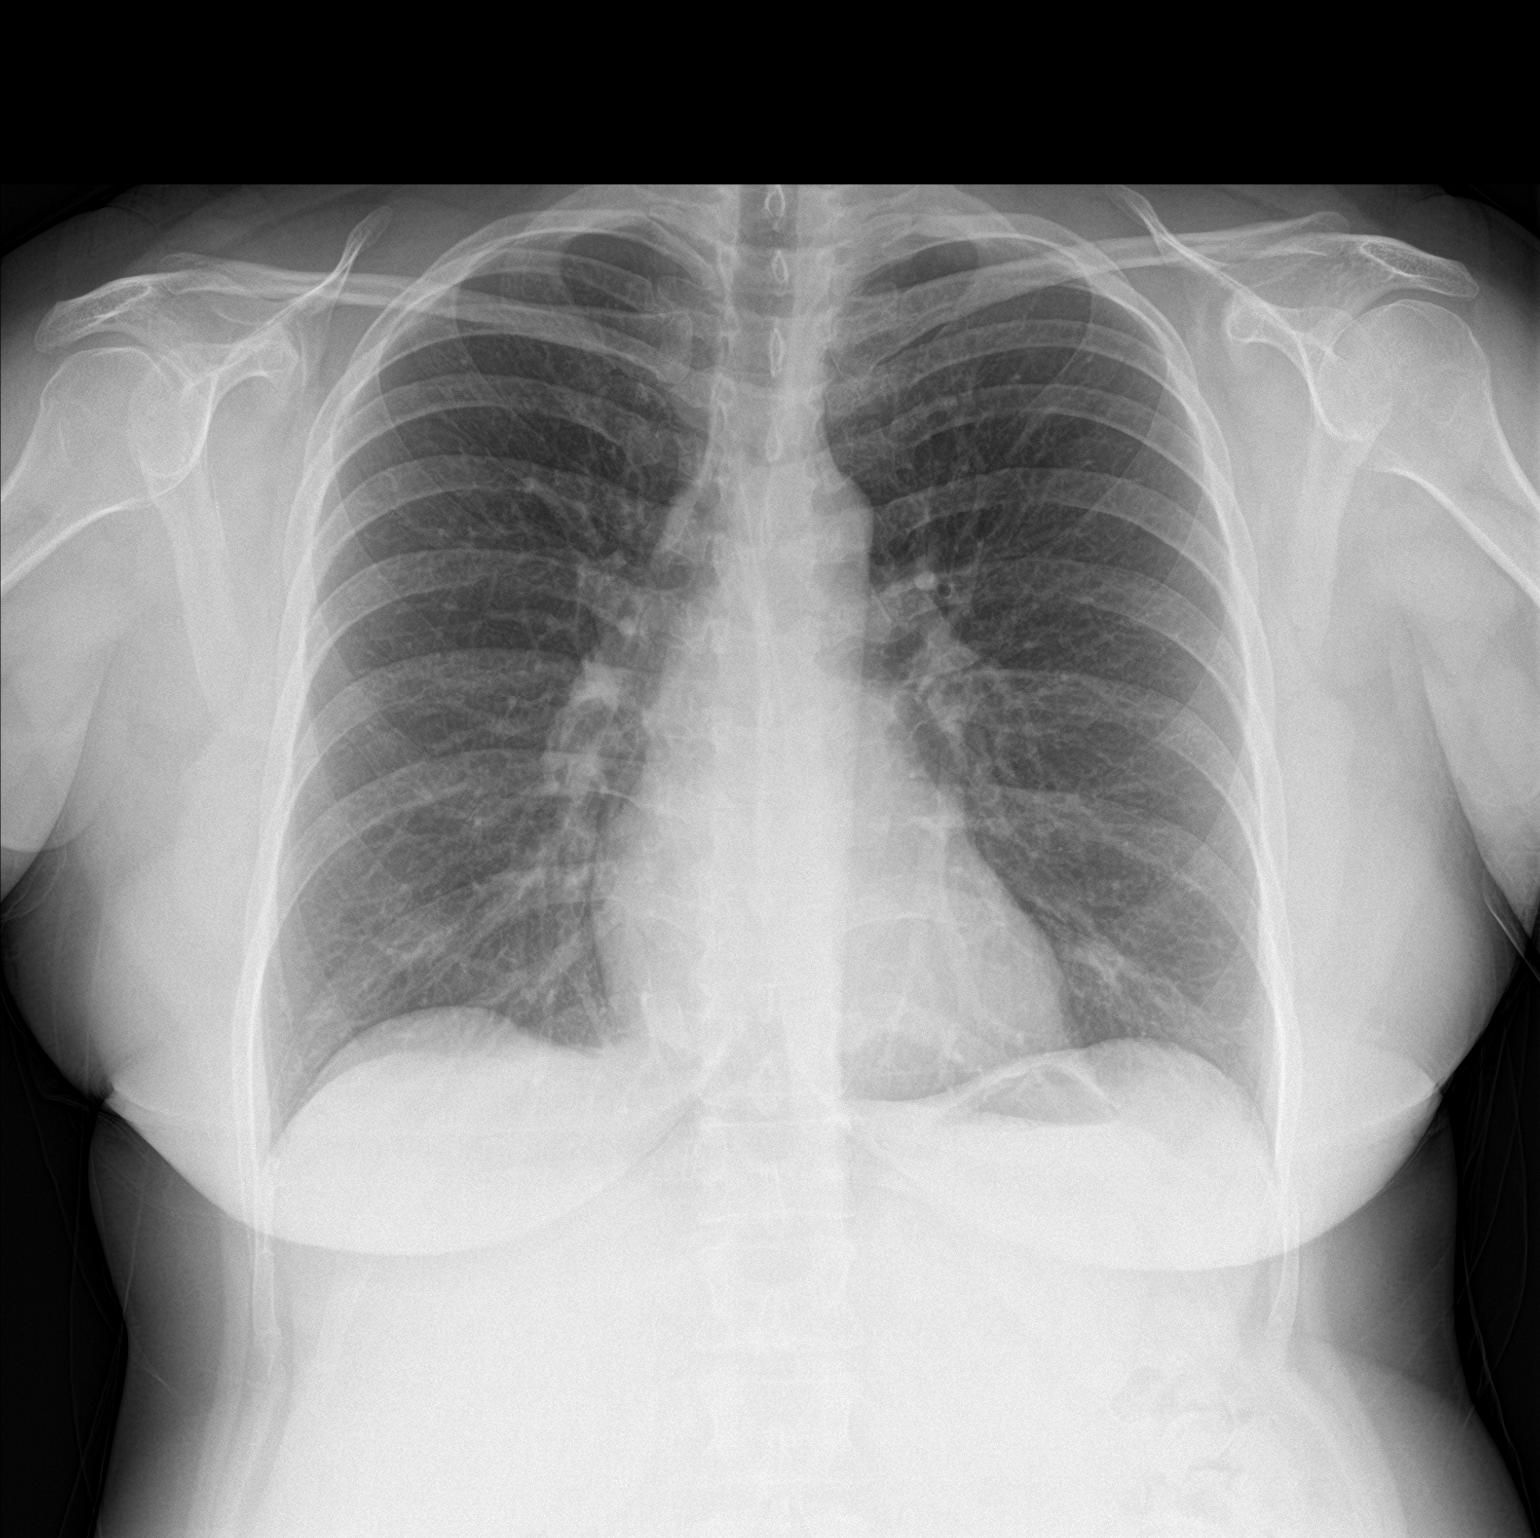

[chest lat]
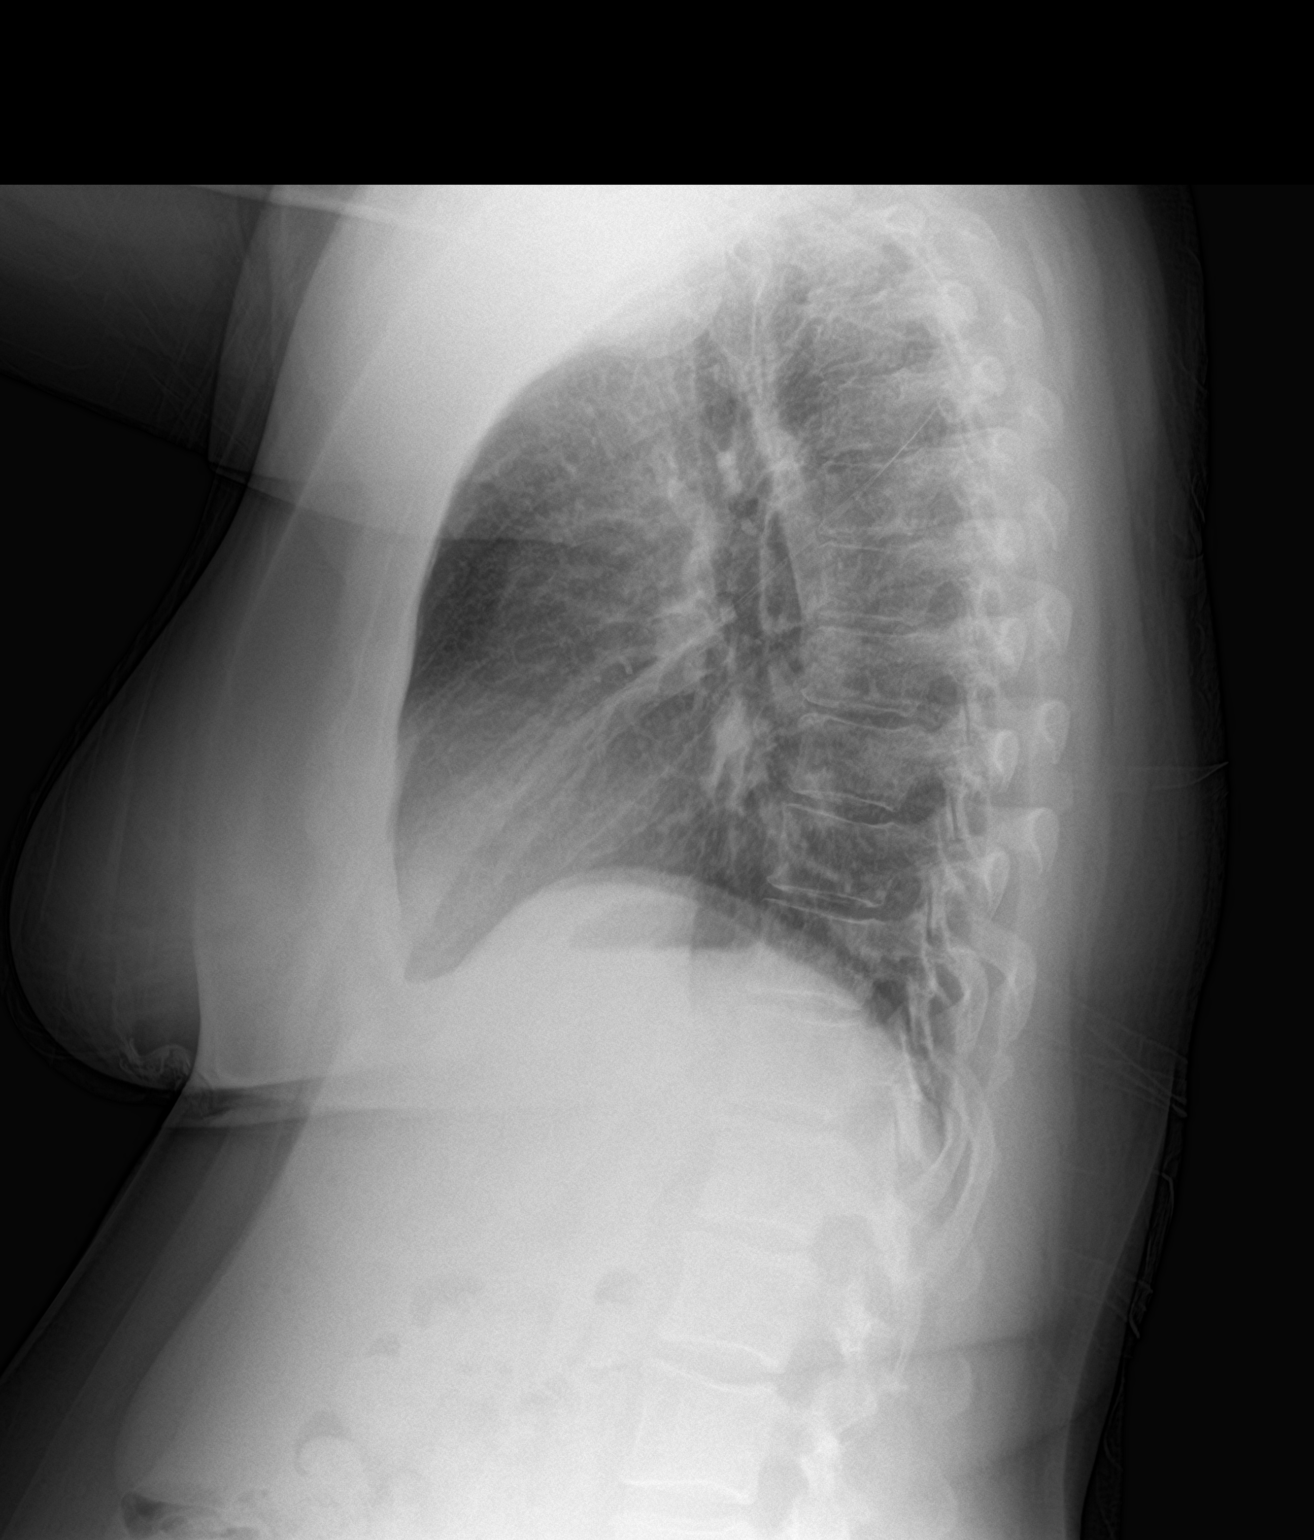

[2 of 2 positions shown; findings below may reference images not displayed]

FINDINGS: The heart size and mediastinal contours are within normal limits.
Both lungs are clear. The visualized skeletal structures are
unremarkable.
IMPRESSION: No active cardiopulmonary disease.

## 2021-11-30 DIAGNOSIS — Z419 Encounter for procedure for purposes other than remedying health state, unspecified: Secondary | ICD-10-CM | POA: Diagnosis not present

## 2021-12-31 DIAGNOSIS — Z419 Encounter for procedure for purposes other than remedying health state, unspecified: Secondary | ICD-10-CM | POA: Diagnosis not present

## 2022-01-28 DIAGNOSIS — Z419 Encounter for procedure for purposes other than remedying health state, unspecified: Secondary | ICD-10-CM | POA: Diagnosis not present

## 2022-02-28 DIAGNOSIS — Z419 Encounter for procedure for purposes other than remedying health state, unspecified: Secondary | ICD-10-CM | POA: Diagnosis not present

## 2022-03-30 DIAGNOSIS — Z419 Encounter for procedure for purposes other than remedying health state, unspecified: Secondary | ICD-10-CM | POA: Diagnosis not present

## 2022-04-30 DIAGNOSIS — Z419 Encounter for procedure for purposes other than remedying health state, unspecified: Secondary | ICD-10-CM | POA: Diagnosis not present

## 2022-05-30 DIAGNOSIS — Z419 Encounter for procedure for purposes other than remedying health state, unspecified: Secondary | ICD-10-CM | POA: Diagnosis not present

## 2022-06-30 DIAGNOSIS — Z419 Encounter for procedure for purposes other than remedying health state, unspecified: Secondary | ICD-10-CM | POA: Diagnosis not present

## 2022-07-31 DIAGNOSIS — Z419 Encounter for procedure for purposes other than remedying health state, unspecified: Secondary | ICD-10-CM | POA: Diagnosis not present

## 2022-08-30 DIAGNOSIS — Z419 Encounter for procedure for purposes other than remedying health state, unspecified: Secondary | ICD-10-CM | POA: Diagnosis not present

## 2022-09-30 DIAGNOSIS — Z419 Encounter for procedure for purposes other than remedying health state, unspecified: Secondary | ICD-10-CM | POA: Diagnosis not present

## 2022-10-30 DIAGNOSIS — Z419 Encounter for procedure for purposes other than remedying health state, unspecified: Secondary | ICD-10-CM | POA: Diagnosis not present

## 2022-11-30 DIAGNOSIS — Z419 Encounter for procedure for purposes other than remedying health state, unspecified: Secondary | ICD-10-CM | POA: Diagnosis not present

## 2022-12-31 DIAGNOSIS — Z419 Encounter for procedure for purposes other than remedying health state, unspecified: Secondary | ICD-10-CM | POA: Diagnosis not present

## 2023-01-29 DIAGNOSIS — Z419 Encounter for procedure for purposes other than remedying health state, unspecified: Secondary | ICD-10-CM | POA: Diagnosis not present

## 2023-03-01 DIAGNOSIS — Z419 Encounter for procedure for purposes other than remedying health state, unspecified: Secondary | ICD-10-CM | POA: Diagnosis not present

## 2023-03-31 DIAGNOSIS — Z419 Encounter for procedure for purposes other than remedying health state, unspecified: Secondary | ICD-10-CM | POA: Diagnosis not present

## 2023-04-29 DIAGNOSIS — Z124 Encounter for screening for malignant neoplasm of cervix: Secondary | ICD-10-CM | POA: Diagnosis not present

## 2023-04-29 DIAGNOSIS — Z1151 Encounter for screening for human papillomavirus (HPV): Secondary | ICD-10-CM | POA: Diagnosis not present

## 2023-04-29 DIAGNOSIS — Z3040 Encounter for surveillance of contraceptives, unspecified: Secondary | ICD-10-CM | POA: Diagnosis not present

## 2023-04-29 DIAGNOSIS — N393 Stress incontinence (female) (male): Secondary | ICD-10-CM | POA: Diagnosis not present

## 2023-04-29 DIAGNOSIS — L292 Pruritus vulvae: Secondary | ICD-10-CM | POA: Diagnosis not present

## 2023-04-29 DIAGNOSIS — N760 Acute vaginitis: Secondary | ICD-10-CM | POA: Diagnosis not present

## 2023-04-29 DIAGNOSIS — B372 Candidiasis of skin and nail: Secondary | ICD-10-CM | POA: Diagnosis not present

## 2023-05-01 DIAGNOSIS — Z419 Encounter for procedure for purposes other than remedying health state, unspecified: Secondary | ICD-10-CM | POA: Diagnosis not present

## 2023-05-31 DIAGNOSIS — Z419 Encounter for procedure for purposes other than remedying health state, unspecified: Secondary | ICD-10-CM | POA: Diagnosis not present

## 2023-07-01 DIAGNOSIS — Z419 Encounter for procedure for purposes other than remedying health state, unspecified: Secondary | ICD-10-CM | POA: Diagnosis not present

## 2023-08-01 DIAGNOSIS — Z419 Encounter for procedure for purposes other than remedying health state, unspecified: Secondary | ICD-10-CM | POA: Diagnosis not present

## 2023-08-05 DIAGNOSIS — S39012A Strain of muscle, fascia and tendon of lower back, initial encounter: Secondary | ICD-10-CM | POA: Diagnosis not present

## 2023-08-05 DIAGNOSIS — M549 Dorsalgia, unspecified: Secondary | ICD-10-CM | POA: Diagnosis not present

## 2023-08-05 DIAGNOSIS — X501XXA Overexertion from prolonged static or awkward postures, initial encounter: Secondary | ICD-10-CM | POA: Diagnosis not present

## 2023-08-31 DIAGNOSIS — Z419 Encounter for procedure for purposes other than remedying health state, unspecified: Secondary | ICD-10-CM | POA: Diagnosis not present

## 2023-10-01 DIAGNOSIS — Z419 Encounter for procedure for purposes other than remedying health state, unspecified: Secondary | ICD-10-CM | POA: Diagnosis not present

## 2023-10-31 DIAGNOSIS — Z419 Encounter for procedure for purposes other than remedying health state, unspecified: Secondary | ICD-10-CM | POA: Diagnosis not present

## 2023-12-01 DIAGNOSIS — Z419 Encounter for procedure for purposes other than remedying health state, unspecified: Secondary | ICD-10-CM | POA: Diagnosis not present

## 2024-01-01 DIAGNOSIS — Z419 Encounter for procedure for purposes other than remedying health state, unspecified: Secondary | ICD-10-CM | POA: Diagnosis not present

## 2024-01-17 DIAGNOSIS — B9689 Other specified bacterial agents as the cause of diseases classified elsewhere: Secondary | ICD-10-CM | POA: Diagnosis not present

## 2024-01-17 DIAGNOSIS — J019 Acute sinusitis, unspecified: Secondary | ICD-10-CM | POA: Diagnosis not present

## 2024-01-29 DIAGNOSIS — Z419 Encounter for procedure for purposes other than remedying health state, unspecified: Secondary | ICD-10-CM | POA: Diagnosis not present

## 2024-02-14 DIAGNOSIS — F330 Major depressive disorder, recurrent, mild: Secondary | ICD-10-CM | POA: Diagnosis not present

## 2024-02-14 DIAGNOSIS — E6609 Other obesity due to excess calories: Secondary | ICD-10-CM | POA: Diagnosis not present

## 2024-02-14 DIAGNOSIS — Z0000 Encounter for general adult medical examination without abnormal findings: Secondary | ICD-10-CM | POA: Diagnosis not present

## 2024-02-14 DIAGNOSIS — Z130 Encounter for screening for diseases of the blood and blood-forming organs and certain disorders involving the immune mechanism: Secondary | ICD-10-CM | POA: Diagnosis not present

## 2024-02-14 DIAGNOSIS — D224 Melanocytic nevi of scalp and neck: Secondary | ICD-10-CM | POA: Diagnosis not present

## 2024-02-14 DIAGNOSIS — E66812 Obesity, class 2: Secondary | ICD-10-CM | POA: Diagnosis not present

## 2024-02-14 DIAGNOSIS — Z13220 Encounter for screening for lipoid disorders: Secondary | ICD-10-CM | POA: Diagnosis not present

## 2024-02-14 DIAGNOSIS — Z6835 Body mass index (BMI) 35.0-35.9, adult: Secondary | ICD-10-CM | POA: Diagnosis not present

## 2024-02-14 DIAGNOSIS — R61 Generalized hyperhidrosis: Secondary | ICD-10-CM | POA: Diagnosis not present

## 2024-03-11 DIAGNOSIS — Z419 Encounter for procedure for purposes other than remedying health state, unspecified: Secondary | ICD-10-CM | POA: Diagnosis not present

## 2024-04-10 DIAGNOSIS — Z419 Encounter for procedure for purposes other than remedying health state, unspecified: Secondary | ICD-10-CM | POA: Diagnosis not present

## 2024-05-11 DIAGNOSIS — Z419 Encounter for procedure for purposes other than remedying health state, unspecified: Secondary | ICD-10-CM | POA: Diagnosis not present

## 2024-06-10 DIAGNOSIS — Z419 Encounter for procedure for purposes other than remedying health state, unspecified: Secondary | ICD-10-CM | POA: Diagnosis not present

## 2024-07-11 DIAGNOSIS — Z419 Encounter for procedure for purposes other than remedying health state, unspecified: Secondary | ICD-10-CM | POA: Diagnosis not present

## 2024-08-11 DIAGNOSIS — Z419 Encounter for procedure for purposes other than remedying health state, unspecified: Secondary | ICD-10-CM | POA: Diagnosis not present

## 2024-09-18 DIAGNOSIS — Z13220 Encounter for screening for lipoid disorders: Secondary | ICD-10-CM | POA: Diagnosis not present

## 2024-09-18 DIAGNOSIS — F330 Major depressive disorder, recurrent, mild: Secondary | ICD-10-CM | POA: Diagnosis not present

## 2024-09-18 DIAGNOSIS — Z83438 Family history of other disorder of lipoprotein metabolism and other lipidemia: Secondary | ICD-10-CM | POA: Diagnosis not present

## 2024-09-18 DIAGNOSIS — Z130 Encounter for screening for diseases of the blood and blood-forming organs and certain disorders involving the immune mechanism: Secondary | ICD-10-CM | POA: Diagnosis not present

## 2024-10-11 DIAGNOSIS — Z419 Encounter for procedure for purposes other than remedying health state, unspecified: Secondary | ICD-10-CM | POA: Diagnosis not present

## 2024-11-13 DIAGNOSIS — N898 Other specified noninflammatory disorders of vagina: Secondary | ICD-10-CM | POA: Diagnosis not present

## 2024-11-13 DIAGNOSIS — Z01419 Encounter for gynecological examination (general) (routine) without abnormal findings: Secondary | ICD-10-CM | POA: Diagnosis not present

## 2024-11-13 DIAGNOSIS — B369 Superficial mycosis, unspecified: Secondary | ICD-10-CM | POA: Diagnosis not present

## 2024-11-13 DIAGNOSIS — Z130 Encounter for screening for diseases of the blood and blood-forming organs and certain disorders involving the immune mechanism: Secondary | ICD-10-CM | POA: Diagnosis not present

## 2024-11-13 DIAGNOSIS — Z3044 Encounter for surveillance of vaginal ring hormonal contraceptive device: Secondary | ICD-10-CM | POA: Diagnosis not present

## 2024-11-13 DIAGNOSIS — Z1389 Encounter for screening for other disorder: Secondary | ICD-10-CM | POA: Diagnosis not present
